# Patient Record
Sex: Female | Born: 1965 | Race: Black or African American | Hispanic: No | Marital: Married | Smoking: Never smoker
Health system: Southern US, Community
[De-identification: ages and names within clinical notes are randomized; demographics above are authoritative.]

## PROBLEM LIST (undated history)

## (undated) DIAGNOSIS — I10 Essential (primary) hypertension: Secondary | ICD-10-CM

## (undated) DIAGNOSIS — J45909 Unspecified asthma, uncomplicated: Secondary | ICD-10-CM

---

## 2012-11-29 ENCOUNTER — Emergency Department (HOSPITAL_BASED_OUTPATIENT_CLINIC_OR_DEPARTMENT_OTHER)
Admission: EM | Admit: 2012-11-29 | Discharge: 2012-11-29 | Disposition: A | Discharge status: Home / Self Care | Payer: Self-pay | Attending: Emergency Medicine | Admitting: Emergency Medicine

## 2012-11-29 ENCOUNTER — Encounter (HOSPITAL_BASED_OUTPATIENT_CLINIC_OR_DEPARTMENT_OTHER): Payer: Self-pay | Admitting: *Deleted

## 2012-11-29 DIAGNOSIS — Z79899 Other long term (current) drug therapy: Secondary | ICD-10-CM | POA: Insufficient documentation

## 2012-11-29 DIAGNOSIS — I10 Essential (primary) hypertension: Secondary | ICD-10-CM | POA: Insufficient documentation

## 2012-11-29 DIAGNOSIS — H6091 Unspecified otitis externa, right ear: Secondary | ICD-10-CM

## 2012-11-29 DIAGNOSIS — H60399 Other infective otitis externa, unspecified ear: Secondary | ICD-10-CM | POA: Insufficient documentation

## 2012-11-29 DIAGNOSIS — J069 Acute upper respiratory infection, unspecified: Secondary | ICD-10-CM | POA: Insufficient documentation

## 2012-11-29 DIAGNOSIS — Z91040 Latex allergy status: Secondary | ICD-10-CM | POA: Insufficient documentation

## 2012-11-29 HISTORY — DX: Essential (primary) hypertension: I10

## 2012-11-29 MED ORDER — NEOMYCIN-POLYMYXIN-HC 3.5-10000-1 OT SUSP: 4.0000 [drp] | Freq: Four times a day (QID) | OTIC | Status: DC

## 2012-11-29 MED ORDER — FLUTICASONE PROPIONATE 50 MCG/ACT NA SUSP: 2.0000 | Freq: Every day | NASAL | Status: DC

## 2012-11-29 NOTE — ED Notes (Addendum)
Sore throat, congestion, hoarse voice, earache. Pt was seen at [facility] and states that the abx makes her stomach cramp.

## 2012-11-29 NOTE — ED Provider Notes (Signed)
History    CSN: [id number] Arrival date & time 11/29/12  4098  First MD Initiated Contact with Patient 11/29/12 [phone]     Chief Complaint  Patient presents with  . Sore Throat   (Consider location/radiation/quality/duration/timing/severity/associated sxs/prior Treatment) HPI This 47 year old female has one half weeks of some nasal congestion and postnasal drainage right ear pain sore throat hoarse voice cough but no fever no rash no chest pain no wheezing no shortness of breath no vomiting no diarrhea no trauma no headache no stiff neck she started Bactrim yesterday from another emergency department for an unknown reason and when she took her Bactrim Last night and when she took a Bactrim tablet this morning just like she has in the past with prior antibiotics developed epigastric abdominal pain without nausea or vomiting and without rash without itching without shortness of breath without tongue swelling or lip swelling but states that she is intolerant of all antibiotics which always seem to cause epigastric pain whenever she takes them.She is taking a nasal decongestant as well as antihistamine and would like a refill on her Flonase which seems to help her nasal congestion but she ran out. Past Medical History  Diagnosis Date  . Hypertension    No past surgical history on file. No family history on file. History  Substance Use Topics  . Smoking status: Not on file  . Smokeless tobacco: Not on file  . Alcohol Use: Not on file   OB History   Grav Para Term Preterm Abortions TAB SAB Ect Mult Living                 Review of Systems 10 Systems reviewed and are negative for acute change except as noted in the HPI. Allergies  Latex  Home Medications   Current Outpatient Rx  Name  Route  Sig  Dispense  Refill  . fluticasone (FLONASE) 50 MCG/ACT nasal spray   Nasal   Place 2 sprays into the nose daily.   16 g   0   . neomycin-polymyxin-hydrocortisone (CORTISPORIN)  3.5-10000-1 otic suspension   Otic   Place 4 drops in ear(s) 4 (four) times daily. X 7 days   10 mL   0    BP 164/104  Pulse 102  Temp(Src) 98.4 F (36.9 C) (Oral)  Resp 18  SpO2 100% Physical Exam  Nursing note and vitals reviewed. Constitutional:  Awake, alert, nontoxic appearance.  HENT:  Head: Atraumatic.  Mouth/Throat: Oropharynx is clear and moist. No oropharyngeal exudate.  TMs clear bilaterally right external auditory canal is not swollen or purulence however does have erythema and tenderness in the canal without cellulitis to the ear itself  Eyes: Conjunctivae are normal. Right eye exhibits no discharge. Left eye exhibits no discharge.  Neck: Neck supple.  Cardiovascular: Normal rate and regular rhythm.   No murmur heard. Pulmonary/Chest: Effort normal and breath sounds normal. No respiratory distress. She has no wheezes. She has no rales. She exhibits no tenderness.  Abdominal: Soft. Bowel sounds are normal. She exhibits no distension and no mass. There is tenderness. There is no rebound and no guarding.  Minimal epigastric tenderness with the rest of the abdomen nontender  Musculoskeletal: She exhibits no tenderness.  Baseline ROM, no obvious new focal weakness.  Neurological: She is alert.  Mental status and motor strength appears baseline for patient and situation.  Skin: No rash noted.  Psychiatric: She has a normal mood and affect.    ED Course  Procedures (including  critical care time) Patient is confident the antibiotic upset her stomach and does not want any further evaluation of her abdominal pain today including no lab testing or imaging.Patient informed of clinical course, understand medical decision-making process, and agree with plan. Labs Reviewed - No data to display No results found. 1. URI (upper respiratory infection)   2. Right otitis externa     MDM  I doubt any other EMC precluding discharge at this time including, but not necessarily  limited to the following:SBI.  Hurman Horn, MD 11/29/12 [phone]

## 2013-05-03 ENCOUNTER — Emergency Department (HOSPITAL_COMMUNITY): Payer: No Typology Code available for payment source

## 2013-05-03 ENCOUNTER — Emergency Department (HOSPITAL_COMMUNITY)
Admission: EM | Admit: 2013-05-03 | Discharge: 2013-05-03 | Disposition: A | Discharge status: Home / Self Care | Payer: No Typology Code available for payment source | Attending: Emergency Medicine | Admitting: Emergency Medicine

## 2013-05-03 ENCOUNTER — Encounter (HOSPITAL_COMMUNITY): Payer: Self-pay | Admitting: Emergency Medicine

## 2013-05-03 DIAGNOSIS — Z888 Allergy status to other drugs, medicaments and biological substances status: Secondary | ICD-10-CM | POA: Insufficient documentation

## 2013-05-03 DIAGNOSIS — Y939 Activity, unspecified: Secondary | ICD-10-CM | POA: Insufficient documentation

## 2013-05-03 DIAGNOSIS — R269 Unspecified abnormalities of gait and mobility: Secondary | ICD-10-CM | POA: Insufficient documentation

## 2013-05-03 DIAGNOSIS — M25572 Pain in left ankle and joints of left foot: Secondary | ICD-10-CM

## 2013-05-03 DIAGNOSIS — Z880 Allergy status to penicillin: Secondary | ICD-10-CM | POA: Insufficient documentation

## 2013-05-03 DIAGNOSIS — S9000XA Contusion of unspecified ankle, initial encounter: Secondary | ICD-10-CM | POA: Insufficient documentation

## 2013-05-03 DIAGNOSIS — I10 Essential (primary) hypertension: Secondary | ICD-10-CM | POA: Insufficient documentation

## 2013-05-03 DIAGNOSIS — R209 Unspecified disturbances of skin sensation: Secondary | ICD-10-CM | POA: Insufficient documentation

## 2013-05-03 DIAGNOSIS — S9002XA Contusion of left ankle, initial encounter: Secondary | ICD-10-CM

## 2013-05-03 DIAGNOSIS — W208XXA Other cause of strike by thrown, projected or falling object, initial encounter: Secondary | ICD-10-CM | POA: Insufficient documentation

## 2013-05-03 DIAGNOSIS — Y929 Unspecified place or not applicable: Secondary | ICD-10-CM | POA: Insufficient documentation

## 2013-05-03 DIAGNOSIS — Z91040 Latex allergy status: Secondary | ICD-10-CM | POA: Insufficient documentation

## 2013-05-03 MED ORDER — IBUPROFEN 800 MG PO TABS: 800.0000 mg | ORAL_TABLET | Freq: Three times a day (TID) | ORAL | Status: DC

## 2013-05-03 MED ORDER — IBUPROFEN 400 MG PO TABS
800.0000 mg | ORAL_TABLET | Freq: Once | ORAL | Status: AC
  Administered 2013-05-03: 800 mg via ORAL
  Filled 2013-05-03: qty 2

## 2013-05-03 NOTE — ED Notes (Signed)
Pt. Not in the room after coming back from xray.

## 2013-05-03 NOTE — ED Notes (Signed)
PT comfortable with d/c and f/u instructions. Prescriptions x1 

## 2013-05-03 NOTE — ED Notes (Signed)
Pt states will take Ibuprofen or Tylenol but does not want anything stronger for pain

## 2013-05-03 NOTE — ED Notes (Signed)
Pt. Stated, i dropped a couch on my left foot a week ago and keeps getting worse.

## 2013-05-03 NOTE — ED Provider Notes (Signed)
CSN: [id number]     Arrival date & time 05/03/13  1618 History  This chart was scribed for non-physician practitioner, Dierdre Forth, PA-C working with Roney Marion, MD by Greggory Stallion, ED scribe. This patient was seen in room TR05C/TR05C and the patient's care was started at 5:39 PM.   Chief Complaint  Patient presents with  . Foot Injury   The history is provided by the patient and medical records. No language interpreter was used.   HPI Comments: Dawanna Grauberger is a 47 y.o. female who presents to the Emergency Department complaining of left foot injury that occurred one week ago when she dropped a couch on her foot. She has sudden onset, worsening, throbbing left foot pain. Pt has taken aleve and wrapped her foot in an ACE wrap with some relief. Certain movements and bearing weight worsen the pain. Pt states she has some tingling in her toes. Denies leg pain, numbness.  Past Medical History  Diagnosis Date  . Hypertension    History reviewed. No pertinent past surgical history. No family history on file. History  Substance Use Topics  . Smoking status: Not on file  . Smokeless tobacco: Not on file  . Alcohol Use: No   OB History   Grav Para Term Preterm Abortions TAB SAB Ect Mult Living                 Review of Systems  Constitutional: Negative for fever and chills.  Gastrointestinal: Negative for nausea and vomiting.  Musculoskeletal: Positive for arthralgias, gait problem (2/2 pain) and joint swelling. Negative for back pain, neck pain and neck stiffness.  Skin: Negative for wound.  Neurological: Negative for numbness.  Hematological: Does not bruise/bleed easily.  Psychiatric/Behavioral: The patient is not nervous/anxious.   All other systems reviewed and are negative.    Allergies  Latex; Penicillins; and Vagisil anti-itch medicated  Home Medications   Current Outpatient Rx  Name  Route  Sig  Dispense  Refill  . ibuprofen (ADVIL,MOTRIN) 200 MG tablet   Oral   Take 400 mg by mouth every 6 (six) hours as needed for mild pain (foot pain).         Marland Kitchen ibuprofen (ADVIL,MOTRIN) 800 MG tablet   Oral   Take 1 tablet (800 mg total) by mouth 3 (three) times daily.   21 tablet   0    BP 135/77  Pulse 80  Temp(Src) 98.4 F (36.9 C) (Oral)  Resp 16  SpO2 98%  Physical Exam  Nursing note and vitals reviewed. Constitutional: She appears well-developed and well-nourished. No distress.  HENT:  Head: Normocephalic and atraumatic.  Eyes: Conjunctivae are normal.  Neck: Normal range of motion.  Cardiovascular: Normal rate, regular rhythm, normal heart sounds and intact distal pulses.   No murmur heard. Capillary refill < 3 seconds  Pulmonary/Chest: Effort normal and breath sounds normal.  Musculoskeletal: She exhibits tenderness. She exhibits no edema.  ROM: full ROM with pain. Quarter sized area of ecchymosis to the anterior ankle with mild swelling laterally.  No PPT of the medial or lateral malleolus.    Neurological: She is alert. Coordination normal.  Sensation intact with subjective paraesthesias over lateral portion of the left foot.  Strength is 3/5 in left ankle secondary to pain.   Skin: Skin is warm and dry. She is not diaphoretic.  No tenting of the skin  Psychiatric: She has a normal mood and affect.    ED Course  Procedures (including critical care  time)  DIAGNOSTIC STUDIES: Oxygen Saturation is 98% on RA, normal by my interpretation.    COORDINATION OF CARE: 5:41 PM-Discussed treatment plan which includes xray with pt at bedside and pt agreed to plan.   Labs Review Labs Reviewed - No data to display Imaging Review Dg Ankle Complete Left  05/03/2013   CLINICAL DATA:  History of injury complaining of pain and swelling in the lateral side of the left foot.  EXAM: LEFT ANKLE COMPLETE - 3+ VIEW  COMPARISON:  No priors.  FINDINGS: Three views the left ankle demonstrate irregularity of the tip of the medial malleolus,  suggestive of an old healed avulsion fracture. No definite acute displaced fracture, subluxation or dislocation. Mild soft tissue swelling around the ankle joint diffusely. Dorsal and plantar calcaneal spurs are incidentally noted.  IMPRESSION: 1. No definite acute radiographic abnormality the bones of the left ankle. 2. Probable old healed avulsion fracture of the tip of the medial malleolus.   Electronically Signed   By: Trudie Reed M.D.   On: 05/03/2013 19:02   Dg Foot Complete Left  05/03/2013   CLINICAL DATA:  History injury to left foot complaining of pain and swelling.  EXAM: LEFT FOOT - COMPLETE 3+ VIEW  COMPARISON:  No priors.  FINDINGS: Multiple views of the left foot demonstrate no acute displaced fracture, subluxation, dislocation, or soft tissue abnormality. Small plantar and dorsal calcaneal spurs incidentally noted.  IMPRESSION: 1. No acute radiographic abnormality of the left foot.   Electronically Signed   By: Trudie Reed M.D.   On: 05/03/2013 19:01    EKG Interpretation   None       MDM   1. Ankle contusion, left, initial encounter   2. Arthralgia of left ankle      Garrel Ridgel presents with L ankle pain and minor contusion. Patient X-Ray negative for obvious fracture or dislocation. I personally reviewed the imaging tests through PACS system.  I reviewed available ER/hospitalization records through the EMR.  Pt advised to follow up with orthopedics if symptoms persist for possibility of missed fracture diagnosis. Patient given ASO brace while in ED, conservative therapy recommended and discussed. Pt ambulates without difficulty in the ED.  Patient will be dc home & is agreeable with above plan.  It has been determined that no acute conditions requiring further emergency intervention are present at this time. The patient/guardian have been advised of the diagnosis and plan. We have discussed signs and symptoms that warrant return to the ED, such as changes or  worsening in symptoms.   Vital signs are stable at discharge.   BP 135/77  Pulse 80  Temp(Src) 98.4 F (36.9 C) (Oral)  Resp 16  SpO2 98%  Patient/guardian has voiced understanding and agreed to follow-up with the PCP or specialist.    I personally performed the services described in this documentation, which was scribed in my presence. The recorded information has been reviewed and is accurate.    Dierdre Forth, PA-C 05/03/13 [id number]

## 2013-05-13 NOTE — ED Provider Notes (Signed)
Medical screening examination/treatment/procedure(s) were performed by non-physician practitioner and as supervising physician I was immediately available for consultation/collaboration.  EKG Interpretation   None         Roney Marion, MD 05/13/13 [phone]

## 2013-06-19 ENCOUNTER — Emergency Department (HOSPITAL_COMMUNITY)
Admission: EM | Admit: 2013-06-19 | Discharge: 2013-06-19 | Disposition: A | Discharge status: Home / Self Care | Payer: No Typology Code available for payment source | Attending: Emergency Medicine | Admitting: Emergency Medicine

## 2013-06-19 ENCOUNTER — Encounter (HOSPITAL_COMMUNITY): Payer: Self-pay | Admitting: Emergency Medicine

## 2013-06-19 ENCOUNTER — Emergency Department (HOSPITAL_COMMUNITY): Payer: No Typology Code available for payment source

## 2013-06-19 DIAGNOSIS — Z91040 Latex allergy status: Secondary | ICD-10-CM | POA: Insufficient documentation

## 2013-06-19 DIAGNOSIS — R070 Pain in throat: Secondary | ICD-10-CM

## 2013-06-19 DIAGNOSIS — K219 Gastro-esophageal reflux disease without esophagitis: Secondary | ICD-10-CM | POA: Insufficient documentation

## 2013-06-19 DIAGNOSIS — Z880 Allergy status to penicillin: Secondary | ICD-10-CM | POA: Insufficient documentation

## 2013-06-19 DIAGNOSIS — I10 Essential (primary) hypertension: Secondary | ICD-10-CM | POA: Insufficient documentation

## 2013-06-19 MED ORDER — OMEPRAZOLE 20 MG PO CPDR: 20.0000 mg | DELAYED_RELEASE_CAPSULE | Freq: Every day | ORAL | Status: DC

## 2013-06-19 NOTE — ED Notes (Addendum)
Per [facility], pt was at [employer] eating a croissant and on her last bite while swallowing felt something sharp in the back of her throat. Pt is hoarse. Progressively worsening hoarseness since EMS arrival to scene. Nothing in throat, no blood, not able to cough anything out.

## 2013-06-19 NOTE — Discharge Instructions (Signed)
Please call and set-up an appointment with ENT physician to be re-assessed within 48 hours.  Please rest and stay hydrated Please take medications as prescribed Please avoid spicy, acidic foods Please continue to monitor symptoms closely and if symptoms are to worsen or change (fever greater than 101, chills, neck pain, neck stiffness, chest pain, shortness of breath, difficulty breathing, neck swelling, coughing up or spitting up blood, confusion, weakness, inability to swallow) please report back to the ED   Diet for Gastroesophageal Reflux Disease, Adult Reflux (acid reflux) is when acid from your stomach flows up into the esophagus. When acid comes in contact with the esophagus, the acid causes irritation and soreness (inflammation) in the esophagus. When reflux happens often or so severely that it causes damage to the esophagus, it is called gastroesophageal reflux disease (GERD). Nutrition therapy can help ease the discomfort of GERD. FOODS OR DRINKS TO AVOID OR LIMIT  Smoking or chewing tobacco. Nicotine is one of the most potent stimulants to acid production in the gastrointestinal tract.  Caffeinated and decaffeinated coffee and black tea.  Regular or low-calorie carbonated beverages or energy drinks (caffeine-free carbonated beverages are allowed).   Strong spices, such as black pepper, white pepper, red pepper, cayenne, curry powder, and chili powder.  Peppermint or spearmint.  Chocolate.  High-fat foods, including meats and fried foods. Extra added fats including oils, butter, salad dressings, and nuts. Limit these to less than 8 tsp per day.  Fruits and vegetables if they are not tolerated, such as citrus fruits or tomatoes.  Alcohol.  Any food that seems to aggravate your condition. If you have questions regarding your diet, call your caregiver or a registered dietitian. OTHER THINGS THAT MAY HELP GERD INCLUDE:   Eating your meals slowly, in a relaxed  setting.  Eating 5 to 6 small meals per day instead of 3 large meals.  Eliminating food for a period of time if it causes distress.  Not lying down until 3 hours after eating a meal.  Keeping the head of your bed raised 6 to 9 inches (15 to 23 cm) by using a foam wedge or blocks under the legs of the bed. Lying flat may make symptoms worse.  Being physically active. Weight loss may be helpful in reducing reflux in overweight or obese adults.  Wear loose fitting clothing EXAMPLE MEAL PLAN This meal plan is approximately 2,000 calories based on https://www.bernard.org/ meal planning guidelines. Breakfast   cup cooked oatmeal.  1 cup strawberries.  1 cup low-fat milk.  1 oz almonds. Snack  1 cup cucumber slices.  6 oz yogurt (made from low-fat or fat-free milk). Lunch  2 slice whole-wheat bread.  2 oz sliced [place].  2 tsp mayonnaise.  1 cup blueberries.  1 cup snap peas. Snack  6 whole-wheat crackers.  1 oz string cheese. Dinner   cup brown rice.  1 cup mixed veggies.  1 tsp olive oil.  3 oz grilled fish. Document Released: 05/20/2005 Document Revised: 08/12/2011 Document Reviewed: 04/05/2011 [facility] Patient Information 2014 [place], [place]. Diet for Gastroesophageal Reflux Disease, Adult Reflux (acid reflux) is when acid from your stomach flows up into the esophagus. When acid comes in contact with the esophagus, the acid causes irritation and soreness (inflammation) in the esophagus. When reflux happens often or so severely that it causes damage to the esophagus, it is called gastroesophageal reflux disease (GERD). Nutrition therapy can help ease the discomfort of GERD. FOODS OR DRINKS TO AVOID OR LIMIT  Smoking  or chewing tobacco. Nicotine is one of the most potent stimulants to acid production in the gastrointestinal tract.  Caffeinated and decaffeinated coffee and black tea.  Regular or low-calorie carbonated beverages or energy drinks (caffeine-free  carbonated beverages are allowed).   Strong spices, such as black pepper, white pepper, red pepper, cayenne, curry powder, and chili powder.  Peppermint or spearmint.  Chocolate.  High-fat foods, including meats and fried foods. Extra added fats including oils, butter, salad dressings, and nuts. Limit these to less than 8 tsp per day.  Fruits and vegetables if they are not tolerated, such as citrus fruits or tomatoes.  Alcohol.  Any food that seems to aggravate your condition. If you have questions regarding your diet, call your caregiver or a registered dietitian. OTHER THINGS THAT MAY HELP GERD INCLUDE:   Eating your meals slowly, in a relaxed setting.  Eating 5 to 6 small meals per day instead of 3 large meals.  Eliminating food for a period of time if it causes distress.  Not lying down until 3 hours after eating a meal.  Keeping the head of your bed raised 6 to 9 inches (15 to 23 cm) by using a foam wedge or blocks under the legs of the bed. Lying flat may make symptoms worse.  Being physically active. Weight loss may be helpful in reducing reflux in overweight or obese adults.  Wear loose fitting clothing EXAMPLE MEAL PLAN This meal plan is approximately 2,000 calories based on https://www.bernard.org/ meal planning guidelines. Breakfast   cup cooked oatmeal.  1 cup strawberries.  1 cup low-fat milk.  1 oz almonds. Snack  1 cup cucumber slices.  6 oz yogurt (made from low-fat or fat-free milk). Lunch  2 slice whole-wheat bread.  2 oz sliced [place].  2 tsp mayonnaise.  1 cup blueberries.  1 cup snap peas. Snack  6 whole-wheat crackers.  1 oz string cheese. Dinner   cup brown rice.  1 cup mixed veggies.  1 tsp olive oil.  3 oz grilled fish. Document Released: 05/20/2005 Document Revised: 08/12/2011 Document Reviewed: 04/05/2011 [facility] Patient Information 2014 [place], [place].   Emergency Department Resource Guide 1) Find a Doctor and Pay  Out of Pocket Although you won't have to find out who is covered by your insurance plan, it is a good idea to ask around and get recommendations. You will then need to call the office and see if the doctor you have chosen will accept you as a new patient and what types of options they offer for patients who are self-pay. Some doctors offer discounts or will set up payment plans for their patients who do not have insurance, but you will need to ask so you aren't surprised when you get to your appointment.  2) Contact Your Local Health Department Not all health departments have doctors that can see patients for sick visits, but many do, so it is worth a call to see if yours does. If you don't know where your local health department is, you can check in your phone book. The CDC also has a tool to help you locate your state's health department, and many state websites also have listings of all of their local health departments.  3) Find a Walk-in Clinic If your illness is not likely to be very severe or complicated, you may want to try a walk in clinic. These are popping up all over the country in pharmacies, drugstores, and shopping centers. They're usually staffed by nurse practitioners  or physician assistants that have been trained to treat common illnesses and complaints. They're usually fairly quick and inexpensive. However, if you have serious medical issues or chronic medical problems, these are probably not your best option.  No Primary Care Doctor: - Call Health Connect at  [phone] - they can help you locate a primary care doctor that  accepts your insurance, provides certain services, etc. - Physician Referral Service- [phone]  Chronic Pain Problems: Organization         Address  Phone   Notes  [facility]  [phone] Patients need to be referred by their primary care doctor.   Medication Assistance: Organization         Address  Phone   Notes  [facility]  Medication [facility] [address] [place], [place] [address] [phone] --Must be a resident of [facility] -- Must have NO insurance coverage whatsoever (no Medicaid/ Medicare, etc.) -- The pt. MUST have a primary care doctor that directs their care regularly and follows them in the community   Med[facility]ist  [phone]   [employer]  [phone]    Agencies that provide inexpensive medical care: Organization         Address  Phone   Notes  [facility]  [phone]   [facility]    [phone]   [facility] [address] [place], [place] [address] [phone]   [facility] [address] [place] [place] [phone]   Planned Parenthood    [phone]   [facility]    [phone]   [facility]  [address], [place] Phone:  [phone], Fax:  [phone] Hours of Operation:  9 am - 6 pm, M-F.  Also accepts Medicaid/Medicare and self-pay.  [facility]  [address], [place] Phone: [phone], Fax: [phone]. Hours of Operation:  8:30 am - 5:30 pm, M-F.  Also accepts Medicaid and self-pay.  [facility] [place] [address] [place] Phone: [phone]   [facility] [address] Natasha Bence [place], [place] [phone], Ext. [phone] Mondays & Thursdays: 7-9 AM.  First 15 patients are seen on a first come, first serve basis.    Medicaid-accepting [facility] Providers:  Organization         Address  Phone   Notes  [facility] [address], [place] [phone] Also accepts self-pay patients.  [facility] [address] Laurell Josephs [place], [place]  [phone]   [facility] [address], [place] [phone]   [facility] [address] [place] [phone]   Renaye Rakers [address], [place]   [phone] Only accepts [place] Access [place] patients after they have their name applied to their card.   Self-Pay (no insurance) in [facility]:  Organization         Address  Phone   Notes  Sickle Cell Patients, [facility] [address] [place], [place] [phone]   [facility] [address] [place], [place] [phone]   [facility] [place]  [address], [place] [phone]   [facility]/Dr. Julio Sicks  [phone]  [address], [place] or [address], [place] [phone] Phone number for both [place] and [place] locations is the same.  [facility] [address], [place] [phone]   [facility] [place] [address] [place] [address], [place] or [address] [phone]   [facility] [address], [place] [phone]) [phone], phone; [phone], fax Sees patients 1st and 3rd Saturday of every month.  Must not qualify for public or private insurance (i.e. Medicaid, Medicare, [place] Health Choice, Veterans' Benefits)  Household income should be no more than 200% of the poverty level The clinic cannot treat you if you are pregnant or think you are pregnant  Sexually transmitted diseases are not treated at the clinic.    Dental Care: Organization         Address  Phone  Notes  [facility] [address], [place] [phone] Accepts children up to age 521 who are enrolled in [place]icaid or [place] Health Choice; pregnant women with a Medicaid card; and children who have applied for Medicaid or [place] Health Choice, but were declined, whose parents can pay a reduced fee at time of service.  [facility] [place]  [address], [place]  [phone] Accepts children up to age 50 who are enrolled in [place]icaid or [place] Health Choice; pregnant women with a Medicaid card; and children who have applied for Medicaid or [place] Health Choice, but were declined, whose parents can pay a reduced fee at time of service.  [facility]  [address], [place] [phone]) [phone] Patients are seen by appointment only. Walk-ins are not accepted. [facility] will see patients 50 years of age and older. Monday - Tuesday (8am-5pm) Most Wednesdays (8:30-5pm) $30 per visit, cash only  [facility]  [address], [place] [facility] [place] [phone]) [phone] Patients are seen by appointment only. Walk-ins are not accepted. [facility] will see patients 50 years of age and older. One Wednesday Evening (Monthly: Volunteer Based).  $30 per visit, cash only  [employer] [facility]  [phone] for adults; Children under age 684, call Graduate Pediatric Dentistry at [phone]. Children aged 304-14, please call [phone] to request a pediatric application.  Dental services are provided in all areas of dental care including fillings, crowns and bridges, complete and partial dentures, implants, gum treatment, root canals, and extractions. Preventive care is also provided. Treatment is provided to both adults and children. Patients are selected via a lottery and there is often a waiting list.   [facility] [address], [place]  [phone] [email or web address]   [facility] [address], [place], [place] [phone], Ext. [phone] Second and Fourth Thursday of each month, opens at 6:30 AM; Clinic ends at 9 AM.  Patients are seen on a first-come first-served basis, and a limited number are seen during each clinic.   [facility]  [address], [place], [place] [phone]   Eligibility Requirements You must have lived in [place], [place], or [place]  counties for at least the last three months.   You cannot be eligible for state or federal sponsored National Cityhealthcare insurance, including CIGNAVeterans Administration, [place]icaid, or [employer]Medicare.   You generally cannot be eligible for healthcare insurance through your employer.  How to apply: Eligibility screenings are held every Tuesday and Wednesday afternoon from 1:00 pm until 4:00 pm. You do not need an appointment for the interview!  [facility] [address], [place], [place]   [place]  [place] Department  [place]  [phone]    Behavioral Health Resources in the Community: Intensive Outpatient Programs Organization         Address  Phone  Notes  Uriah Braham. [address] [place], [place] [phone]   [facility] [address], [place], [place]   ADS: [facility] Drug [facility] [address] [place], [place]   [place] [address],  [place], [place] or [phone]   Substance Abuse Resources Organization         Address  Phone  Notes  Alcohol and Drug Services  [phone]   [place]  [phone]   The [place]   Chinita Pester  [phone]   Residential & Outpatient Substance Abuse Program  [phone]   Psychological Services Organization         Address  Phone  Notes  [facility] [place]  [place]  [phone]   [place] [address] [place] or [phone]    Mobile Crisis Teams Organization         Address  Phone  Notes  [facility], Mobile Crisis Care Unit  [phone]   [facility]  [address] [place], [place]   [place] Levels [address], [place] [phone]    Self-Help/Support Groups Organization         Address  Phone             Notes  [place]. of [place] - variety of support groups  [place] Call for more information  Narcotics Anonymous (NA), [facility]aring [facility] [address], [employer] [place]  2 meetings at this location   Special educational needs teacher         Address  Phone  Notes  [facility]idential [facility] [place],    [place]  [phone]   [facility]  [address] [place] 426834, [place], [place]   [facility] [place], [place] [phone] Admissions: 8am-3pm M-F  Incentives Substance [place] [address],    [place], [place] [phone]   [facility] [address] [place], [place], [place]   [facility] [address]  [place], [place]   [facility] - Intensive Outpatient [address] Kristeen [address], [place], [place]   [facility] ([place].,  [place], [place] [phone] or [phone]   [facility] (RTS) [address], [place], [place] Accepts Medicaid  [facility] [address]  [place] [phone] Substance Abuse/Addiction Treatment   [facility] Organization         Address  Phone  Notes  [facility]  [phone]   Domenic Schwab, PhD [address] Arlis Porta [place], [place]   [phone] or [phone]   [place], [place] [phone]   [facility] [address],  Michell [place], [place] [phone] Insurance/Medicaid/sponsorship through [facility] and Families [address]                                    [place], [place] [phone] Therapy/tele-psych/case  [facility] [address]   [place], [place] [phone]    Dr. Lolly Mustache  [phone]   [facility]  [employer] [facility] Dept. 1) [address], [place] 2) [address], [place] 3)  [address], [place] [phone]  [phone]   [facility] Child Abuse Hotline (831)502-9266 or [phone] (After Hours)

## 2013-06-19 NOTE — ED Notes (Addendum)
Able to eat and drink without any difficulty swallowing. However, still feels a pain with swallowing, like whatever goes down is irritating the area on right side. Drank a cup of water, ate applesauce, and a bite of Malaw[place]urkey sandwich.

## 2013-06-19 NOTE — ED Notes (Signed)
ENT, MD at bedside 

## 2013-06-19 NOTE — ED Provider Notes (Signed)
CSN: [id number]     Arrival date & time 06/19/13  2130 History   First MD Initiated Contact with Patient 06/19/13 1006     Chief Complaint  Patient presents with  . Pain   (Consider location/radiation/quality/duration/timing/severity/associated sxs/prior Treatment) The history is provided by the patient. No language interpreter was used.  Sara Sexton is a 48 year old female with past medical history of hypertension presenting to emergency department with throat discomfort. Patient reports that this morning at approximately 8:30 AM she was eating an egg croissant and french toast sticks at [employer]. Patient reported that when she took the last bite of her breakfast she felt something sharp in her throat. Patient reports that she had immediate discomfort upon swallowing shortly after the event -denies discomfort now. Patient reports she's been having mild hoarseness starting after the event. Patient denies difficulty swallowing or pain with swallowing at the moment-reports she's asymptomatic. Patient reports that her daughter looked into her throat with light on her phone and noted that patient was bleeding - patient was scared and came to the ED. Patient denies metallic taste. Patient reported right-sided neck pain described as an aching sensation, soreness with radiation to the right ear. Denied swelling of the neck, chest pain, shortness of breath, difficulty breathing, numbness, tingling, nausea, vomiting. PCP none  Past Medical History  Diagnosis Date  . Hypertension    History reviewed. No pertinent past surgical history. No family history on file. History  Substance Use Topics  . Smoking status: Never Smoker   . Smokeless tobacco: Not on file  . Alcohol Use: No   OB History   Grav Para Term Preterm Abortions TAB SAB Ect Mult Living                 Review of Systems  Constitutional: Negative for fever and chills.  HENT:       Hoarseness   Respiratory: Negative for chest  tightness and shortness of breath.   Cardiovascular: Negative for chest pain.  Gastrointestinal: Negative for nausea, vomiting, abdominal pain, diarrhea and constipation.  Musculoskeletal: Positive for neck pain.  Neurological: Negative for dizziness and weakness.  All other systems reviewed and are negative.    Allergies  Latex; Penicillins; and Vagisil anti-itch medicated  Home Medications   Current Outpatient Rx  Name  Route  Sig  Dispense  Refill  . omeprazole (PRILOSEC) 20 MG capsule   Oral   Take 1 capsule (20 mg total) by mouth daily.   15 capsule   0    BP 105/65  Pulse 67  Temp(Src) 98.1 F (36.7 C) (Oral)  Resp 16  SpO2 100% Physical Exam  Nursing note and vitals reviewed. Constitutional: She is oriented to person, place, and time. She appears well-developed and well-nourished. No distress.  HENT:  Head: Normocephalic and atraumatic.  Mouth/Throat: Oropharynx is clear and moist. No oropharyngeal exudate.   Negative swelling, erythema, inflammation, lesions, sores noted to the posterior oropharynx and bilateral tonsils. Negative findings of bleeding identified to the posterior oropharynx. Uvula midline, symmetrical elevation. Negative uvula swelling identified. Hoarse voice  Eyes: Conjunctivae and EOM are normal. Pupils are equal, round, and reactive to light. Right eye exhibits no discharge. Left eye exhibits no discharge.  Neck: Normal range of motion. Neck supple. No tracheal deviation present.  Mild discomfort upon palpation to the right side of the neck gas muscular nature Negative cervical lymphadenopathy Negative nuchal rigidity Negative meningeal signs  Cardiovascular: Normal rate, regular rhythm and normal heart sounds.  Exam reveals no friction rub.   No murmur heard. Pulses:      Radial pulses are 2+ on the right side, and 2+ on the left side.  Cap refill less than 3 seconds  Pulmonary/Chest: Effort normal and breath sounds normal. No respiratory  distress. She has no wheezes. She has no rales. She exhibits no tenderness.  Airway intact Negative stridor Patient able to speak in full sentences without difficulty Negative use of accessory muscles  Musculoskeletal: Normal range of motion. She exhibits no tenderness.  Full ROM to upper and lower extremities without difficulty noted, negative ataxia noted.  Lymphadenopathy:    She has no cervical adenopathy.  Neurological: She is alert and oriented to person, place, and time. No cranial nerve deficit. She exhibits normal muscle tone. Coordination normal.  Cranial nerves III through XII grossly intact  Skin: Skin is warm and dry. No rash noted. She is not diaphoretic. No erythema.  Psychiatric: She has a normal mood and affect. Her behavior is normal. Thought content normal.    ED Course  Procedures (including critical care time)  1:44 PM This provider re-assessed patient. Reported that she continues to have discomfort with swallowing. Patient was able to tolerate fluids PO and food without difficulty - stated pain is worse with swallowing than with talking.   1:47 PM Dr. Alden ServerS. Jacubowitz to see and assess patient.   1:52 PM Dr. Dub MikesS. Jacobowitz saw and assess patient, recommended ENT consult.  1:55 PM This provider spoke with Dr. Jearld FentonByers - discussed case, history, presentation. Dr. Jearld FentonByers to see and assess patient.   2:56 PM This provider spoke with Dr. Jearld FentonByers who saw and assessed patient. Fiber-optic scope performed with mild erythema noted. Negative findings of acute abnormalities noted. Patient tolerated procedure well. Dr. Jearld FentonByers believes this to be GERD related, recommended patient to be placed on Omeprazole.   Dg Neck Soft Tissue  06/19/2013   CLINICAL DATA:  Right throat pain. Foreign body sensation in throat.  EXAM: NECK SOFT TISSUES - 1+ VIEW  COMPARISON:  None.  FINDINGS: The prevertebral soft tissues are normal. The epiglottis appears normal. There is calcification in the region of the  arytenoid cartilage. No foreign bodies are identified. Moderate cervical spondylosis is noted.  IMPRESSION: No evidence of foreign body or airway obstruction. Cervical spondylosis.   Electronically Signed   By: Roxy HorsemanBill  Veazey M.D.   On: 06/19/2013 12:58   Dg Chest 2 View  06/19/2013   CLINICAL DATA:  Right throat pain. Foreign body sensation in throat.  EXAM: CHEST  2 VIEW  COMPARISON:  None.  FINDINGS: The heart size and mediastinal contours are normal. The lungs are clear. There is no pleural effusion or pneumothorax. No acute osseous findings are identified. No foreign bodies are identified. Cholecystectomy clips are noted.  IMPRESSION: No active cardiopulmonary process.   Electronically Signed   By: Roxy HorsemanBill  Veazey M.D.   On: 06/19/2013 12:57   Labs Review Labs Reviewed - No data to display Imaging Review Dg Neck Soft Tissue  06/19/2013   CLINICAL DATA:  Right throat pain. Foreign body sensation in throat.  EXAM: NECK SOFT TISSUES - 1+ VIEW  COMPARISON:  None.  FINDINGS: The prevertebral soft tissues are normal. The epiglottis appears normal. There is calcification in the region of the arytenoid cartilage. No foreign bodies are identified. Moderate cervical spondylosis is noted.  IMPRESSION: No evidence of foreign body or airway obstruction. Cervical spondylosis.   Electronically Signed   By: Roxy HorsemanBill  Veazey  M.D.   On: 06/19/2013 12:58   Dg Chest 2 View  06/19/2013   CLINICAL DATA:  Right throat pain. Foreign body sensation in throat.  EXAM: CHEST  2 VIEW  COMPARISON:  None.  FINDINGS: The heart size and mediastinal contours are normal. The lungs are clear. There is no pleural effusion or pneumothorax. No acute osseous findings are identified. No foreign bodies are identified. Cholecystectomy clips are noted.  IMPRESSION: No active cardiopulmonary process.   Electronically Signed   By: Roxy Horseman M.D.   On: 06/19/2013 12:57    EKG Interpretation   None       MDM   1. GERD (gastroesophageal  reflux disease)   2. Throat pain     Filed Vitals:   06/19/13 1136 06/19/13 1234 06/19/13 1330 06/19/13 1400  BP: 112/71 112/68 117/72 105/65  Pulse: 72 68 64 67  Temp:      TempSrc:      Resp: 16 16  16   SpO2: 100% 100% 100% 100%    Patient presenting to emergency department with throat discomfort and neck pain that started this morning at approximately 8:30 AM after patient was eating breakfast at [facility]. Patient reports that with her last bite of the breakfast she noticed and felt something sharp that she swallowed. Patient reports that she no longer has any discomfort when swallowing, concerned regarding that there may be something caught in her throat. Alert oriented. GCS 15. Heart rate and rhythm normal. Lungs clear to auscultation. Radial 2+ bilaterally. Uvula midline, symmetrical elevation. Negative trismus. Negative swelling, erythema, inflammation, lesions, sores, bleeding noted to posterior oropharynx and bilateral tonsils. Negative uvula swelling. Cranial nerves grossly intact. Negative swelling localized to the neck. Discomfort upon palpation to the right side-muscular nature. Negative cervical lymphadenopathy. Negative stridor. Airway intact, patient stable to speak in full sentences without difficulty. Plain film of chest negative findings. Plain film of soft tissue neck negative for airway obstruction, foreign body negative findings. Patient seen and assessed by Dr. Jearld Fenton, ENT, who performed a fiber-optic scope. Reported that there was nothing acutely abnormal. Recommended that this is mainly a GERD like reaction. Recommended patient to be placed on Prilosec as outpatient and follow-up with ENT if symptoms are to worsen. Dr. Jearld Fenton believes this to be an acute exacerbation of GERD.  Patient stable, afebrile. Negative acute findings on plain films. Negative acute abnormalities noted to fiber-optic scoping. Patient tolerated procedure well. Negative lesions. Suspicion to be acute  exacerbation of GERD. Discharged patient with Prilosec. Discharged patient with referral to ENT to be re-assessed within 24-48 hours. Discussed with patient to avoid spicy foods, acidic foods. Discussed with patient to rest and stay hydrated. Discussed with patient to closely monitor symptoms and if symptoms are to worsen or change to report back to the ED - strict return instructions given.  Patient agreed to plan of care, understood, all questions answered.    Raymon Mutton, PA-C 06/19/13 2150

## 2013-06-19 NOTE — Consult Note (Signed)
Reason for Consult: Hoarseness Referring Physician: Emergency room  Garrel RidgelSheryl Sexton is an 48 y.o. female.  HPI: 48 year old who was [facility] and on the last bite of the meal felt pain in the right side of her throat. She is had a persistent soreness since that time. She has had also hoarseness. She has had hoarseness issues like this previously or some type of crusts onwhen she choked her coughed on food. She is still able to get fluids and food down. Is not had an upper respiratory infection recently. She does have symptoms suggestive of reflux on a chronic basis.  Past Medical History  Diagnosis Date  . Hypertension     History reviewed. No pertinent past surgical history.  No family history on file.  Social History:  reports that she has never smoked. She does not have any smokeless tobacco history on file. She reports that she does not drink alcohol or use illicit drugs.  Allergies:  Allergies  Allergen Reactions  . Latex   . Penicillins Hives and Itching  . Vagisil Anti-Itch Medicated [Pramoxine Hcl] Other (See Comments)    Any vaginal yeast infection medication with applicators make her break out    Medications: I have reviewed the patient's current medications.  No results found for this or any previous visit (from the past 48 hour(s)).  Dg Neck Soft Tissue  06/19/2013   CLINICAL DATA:  Right throat pain. Foreign body sensation in throat.  EXAM: NECK SOFT TISSUES - 1+ VIEW  COMPARISON:  None.  FINDINGS: The prevertebral soft tissues are normal. The epiglottis appears normal. There is calcification in the region of the arytenoid cartilage. No foreign bodies are identified. Moderate cervical spondylosis is noted.  IMPRESSION: No evidence of foreign body or airway obstruction. Cervical spondylosis.   Electronically Signed   By: Roxy HorsemanBill  Veazey M.D.   On: 06/19/2013 12:58   Dg Chest 2 View  06/19/2013   CLINICAL DATA:  Right throat  pain. Foreign body sensation in throat.  EXAM: CHEST  2 VIEW  COMPARISON:  None.  FINDINGS: The heart size and mediastinal contours are normal. The lungs are clear. There is no pleural effusion or pneumothorax. No acute osseous findings are identified. No foreign bodies are identified. Cholecystectomy clips are noted.  IMPRESSION: No active cardiopulmonary process.   Electronically Signed   By: Roxy HorsemanBill  Veazey M.D.   On: 06/19/2013 12:57    ROS Blood pressure 112/68, pulse 68, temperature 98.1 F (36.7 C), temperature source Oral, resp. rate 16, SpO2 100.00%. Physical Exam  Constitutional: She appears well-developed and well-nourished.  HENT:  She is awake and alert. Her voice volume is low but I can hear points of normal voice as she is speaking. she no stridor. Nose is clear. Oral cavity/oropharynx-I see no evidence of any lesions or trauma. She does have some hypertrophic lymph tissue in the posterior pharyngeal wall consistent with postnasal drip. Fiberoptic exam shows nasopharynx, pharynx, hypopharynx without any evidence of lesions or exudate. The vocal cords both move normally. There is no lesions. There is erythema of both vocal processes and arytenoids that is equal bilaterally. This is consistent with reflux. Her neck is without masses but she does have tenderness in the right sternocleidomastoid muscle. She is not allow examination of that side well.    Assessment/Plan: Hoarseness-I don't see any evidence of a trauma or injury to her pharynx. The vocal cords have evidence or reflux but no evidence of  any lesions. I suspect that she has reflux as a chronic basis and perhaps had an acute exacerbation. He has normal soft tissue x-rays and eyebrow take exam was within normal limits except for the erythema of the arytenoids. I would recommend Prilosec twice a day. Talked about reflux precautions. If she continues to have the soreness to followup in the next 24-48 hours sooner if it is  worse.  [name] 06/19/2013, 2:47 PM

## 2013-06-19 NOTE — ED Notes (Signed)
Patient transported to X-ray 

## 2013-06-19 NOTE — ED Provider Notes (Signed)
Complains of pain in her right throat and some hoarseness onset immediately after swallowing the last by her sandwich this morning. She thinks she swallowed something sharp. Pain is worse with swallowing and with talking. She is in no respiratory distress. Very slightly hoarse. Handling secretions well. Oral pharynx is normal. Neck nontender. No swelling.  Doug SouSam Jerold Yoss, MD 06/19/13 1357

## 2013-06-19 NOTE — ED Notes (Signed)
Patient returned from X-ray 

## 2013-06-20 NOTE — ED Provider Notes (Signed)
Medical screening examination/treatment/procedure(s) were conducted as a shared visit with non-physician practitioner(s) and myself.  I personally evaluated the patient during the encounter.  EKG Interpretation   None        Doug SouSam Sedonia Kitner, MD 06/20/13 [phone]

## 2014-02-08 ENCOUNTER — Emergency Department (HOSPITAL_COMMUNITY): Payer: Self-pay

## 2014-02-08 ENCOUNTER — Emergency Department (HOSPITAL_COMMUNITY): Payer: No Typology Code available for payment source

## 2014-02-08 ENCOUNTER — Emergency Department (HOSPITAL_COMMUNITY)
Admission: EM | Admit: 2014-02-08 | Discharge: 2014-02-09 | Disposition: A | Discharge status: Home / Self Care | Payer: Self-pay | Attending: Emergency Medicine | Admitting: Emergency Medicine

## 2014-02-08 ENCOUNTER — Encounter (HOSPITAL_COMMUNITY): Payer: Self-pay | Admitting: Emergency Medicine

## 2014-02-08 DIAGNOSIS — I10 Essential (primary) hypertension: Secondary | ICD-10-CM | POA: Insufficient documentation

## 2014-02-08 DIAGNOSIS — R519 Headache, unspecified: Secondary | ICD-10-CM

## 2014-02-08 DIAGNOSIS — Z91040 Latex allergy status: Secondary | ICD-10-CM | POA: Insufficient documentation

## 2014-02-08 DIAGNOSIS — R079 Chest pain, unspecified: Secondary | ICD-10-CM | POA: Insufficient documentation

## 2014-02-08 DIAGNOSIS — Z880 Allergy status to penicillin: Secondary | ICD-10-CM | POA: Insufficient documentation

## 2014-02-08 DIAGNOSIS — R51 Headache: Secondary | ICD-10-CM | POA: Insufficient documentation

## 2014-02-08 DIAGNOSIS — Z79899 Other long term (current) drug therapy: Secondary | ICD-10-CM | POA: Insufficient documentation

## 2014-02-08 DIAGNOSIS — R110 Nausea: Secondary | ICD-10-CM | POA: Insufficient documentation

## 2014-02-08 LAB — CBC
HEMATOCRIT: 36.2 % (ref 36.0–46.0)
HEMOGLOBIN: 12.3 g/dL (ref 12.0–15.0)
MCH: 32.2 pg (ref 26.0–34.0)
MCHC: 34 g/dL (ref 30.0–36.0)
MCV: 94.8 fL (ref 78.0–100.0)
Platelets: 251 10*3/uL (ref 150–400)
RBC: 3.82 MIL/uL — ABNORMAL LOW (ref 3.87–5.11)
RDW: 13 % (ref 11.5–15.5)
WBC: 5.9 10*3/uL (ref 4.0–10.5)

## 2014-02-08 LAB — BASIC METABOLIC PANEL
Anion gap: 10 (ref 5–15)
BUN: 10 mg/dL (ref 6–23)
CHLORIDE: 102 meq/L (ref 96–112)
CO2: 27 mEq/L (ref 19–32)
Calcium: 9.3 mg/dL (ref 8.4–10.5)
Creatinine, Ser: 1.05 mg/dL (ref 0.50–1.10)
GFR calc non Af Amer: 61 mL/min — ABNORMAL LOW (ref >90)
GFR, EST AFRICAN AMERICAN: 71 mL/min — AB (ref >90)
Glucose, Bld: 95 mg/dL (ref 70–99)
POTASSIUM: 3.5 meq/L — AB (ref 3.7–5.3)
SODIUM: 139 meq/L (ref 137–147)

## 2014-02-08 LAB — I-STAT TROPONIN, ED: TROPONIN I, POC: 0 ng/mL (ref 0.00–0.08)

## 2014-02-08 MED ORDER — METOCLOPRAMIDE HCL 5 MG/ML IJ SOLN
10.0000 mg | Freq: Once | INTRAMUSCULAR | Status: AC
  Administered 2014-02-08: 10 mg via INTRAVENOUS
  Filled 2014-02-08: qty 2

## 2014-02-08 MED ORDER — DIPHENHYDRAMINE HCL 50 MG/ML IJ SOLN
25.0000 mg | Freq: Once | INTRAMUSCULAR | Status: AC
  Administered 2014-02-08: 25 mg via INTRAVENOUS
  Filled 2014-02-08: qty 1

## 2014-02-08 MED ORDER — NITROGLYCERIN 0.4 MG SL SUBL: 0.4000 mg | SUBLINGUAL_TABLET | SUBLINGUAL | Status: DC | PRN

## 2014-02-08 MED ORDER — ASPIRIN 325 MG PO TABS
325.0000 mg | ORAL_TABLET | Freq: Once | ORAL | Status: AC
  Administered 2014-02-08: 325 mg via ORAL
  Filled 2014-02-08: qty 1

## 2014-02-08 NOTE — ED Notes (Signed)
Pt reports generalized progressively worsening HA x 4 hours. Denies taking anything for pain. Pt denies vision changes. Pt also reports 10/10 right sided non-radiating chest pressure starting appx 1 hour ago with dizziness and nausea. Pt in NAD. AO x4.

## 2014-02-09 LAB — I-STAT TROPONIN, ED: TROPONIN I, POC: 0 ng/mL (ref 0.00–0.08)

## 2014-02-09 NOTE — ED Provider Notes (Signed)
CSN: [id number]     Arrival date & time 02/08/14  1821 History   First MD Initiated Contact with Patient 02/08/14 2123     Chief Complaint  Patient presents with  . Chest Pain  . Headache     (Consider location/radiation/quality/duration/timing/severity/associated sxs/prior Treatment) Patient is a 48 y.o. female presenting with chest pain and headaches. The history is provided by the patient.  Chest Pain Pain location:  R chest Pain quality: pressure   Pain quality: not aching and not burning   Pain radiates to:  Does not radiate Pain radiates to the back: no   Pain severity:  Moderate Onset quality:  Gradual Duration:  1 hour Timing:  Constant Progression:  Improving (was 10/10, now 3/10) Chronicity:  New Context: at rest   Relieved by:  Rest Worsened by:  Exertion Associated symptoms: headache and nausea   Associated symptoms: no abdominal pain, no cough, no fever, no shortness of breath and not vomiting   Headaches:    Severity:  Mild   Onset quality:  Gradual   Duration:  4 hours   Timing:  Constant   Progression:  Unchanged   Chronicity:  New Headache Pain location:  Generalized Quality:  Dull (pressure, band like) Radiates to:  Does not radiate Onset quality:  Gradual Timing:  Constant Progression:  Unchanged Associated symptoms: nausea   Associated symptoms: no abdominal pain, no cough, no fever and no vomiting     Past Medical History  Diagnosis Date  . Hypertension    History reviewed. No pertinent past surgical history. No family history on file. History  Substance Use Topics  . Smoking status: Never Smoker   . Smokeless tobacco: Not on file  . Alcohol Use: No   OB History   Grav Para Term Preterm Abortions TAB SAB Ect Mult Living                 Review of Systems  Constitutional: Negative for fever.  Respiratory: Negative for cough and shortness of breath.   Cardiovascular: Negative for chest pain and leg swelling.  Gastrointestinal:  Positive for nausea. Negative for vomiting and abdominal pain.  Neurological: Positive for headaches.  All other systems reviewed and are negative.     Allergies  Latex; Penicillins; and Vagisil anti-itch medicated  Home Medications   Prior to Admission medications   Medication Sig Start Date End Date Taking? Authorizing Provider  Cyanocobalamin (VITAMIN B-12 PO) Take 1 tablet by mouth daily.   Yes Historical Provider, MD  Multiple Vitamin (MULTIVITAMIN WITH MINERALS) TABS tablet Take 1 tablet by mouth daily.   Yes Historical Provider, MD  Pyridoxine HCl (VITAMIN B-6 PO) Take by mouth.   Yes Historical Provider, MD   BP 136/99  Pulse 76  Temp(Src) 97.9 F (36.6 C) (Oral)  Resp 15  SpO2 100% Physical Exam  Nursing note and vitals reviewed. Constitutional: She is oriented to person, place, and time. She appears well-developed and well-nourished. No distress.  HENT:  Head: Normocephalic and atraumatic.  Mouth/Throat: Oropharynx is clear and moist.  Eyes: EOM are normal. Pupils are equal, round, and reactive to light.  Neck: Normal range of motion. Neck supple.  Cardiovascular: Normal rate and regular rhythm.  Exam reveals no friction rub.   No murmur heard. Pulmonary/Chest: Effort normal and breath sounds normal. No respiratory distress. She has no wheezes. She has no rales.  Abdominal: Soft. She exhibits no distension. There is no tenderness. There is no rebound.  Musculoskeletal: Normal range  of motion. She exhibits no edema.  Neurological: She is alert and oriented to person, place, and time. No cranial nerve deficit. She exhibits normal muscle tone. Coordination normal.  Skin: Skin is warm. No rash noted. She is not diaphoretic.    ED Course  Procedures (including critical care time) Labs Review Labs Reviewed  CBC - Abnormal; Notable for the following:    RBC 3.82 (*)    All other components within normal limits  BASIC METABOLIC PANEL - Abnormal; Notable for the  following:    Potassium 3.5 (*)    GFR calc non Af Amer 61 (*)    GFR calc Af Amer 71 (*)    All other components within normal limits  I-STAT TROPOININ, ED  Rosezena Sensor, ED    Imaging Review Dg Chest 2 View  02/08/2014   CLINICAL DATA:  Chest pain  EXAM: CHEST  2 VIEW  COMPARISON:  June 19, 2013  FINDINGS: The heart size and mediastinal contours are within normal limits. There is no focal infiltrate, pulmonary edema, or pleural effusion. Prior cholecystectomy clips are identified. The visualized skeletal structures are unremarkable.  IMPRESSION: No active cardiopulmonary disease.   Electronically Signed   By: Sherian Rein M.D.   On: 02/08/2014 19:57   Ct Head Wo Contrast  02/08/2014   CLINICAL DATA:  Headache  EXAM: CT HEAD WITHOUT CONTRAST  TECHNIQUE: Contiguous axial images were obtained from the base of the skull through the vertex without intravenous contrast.  COMPARISON:  None.  FINDINGS: No evidence of parenchymal hemorrhage or extra-axial fluid collection. No mass lesion, mass effect, or midline shift.  No CT evidence of acute infarction.  Cerebral volume is within normal limits.  No ventriculomegaly.  The visualized paranasal sinuses are essentially clear. Partial opacification of the left mastoid air cells.  No evidence of calvarial fracture.  IMPRESSION: No evidence of acute intracranial abnormality.   Electronically Signed   By: Charline Bills M.D.   On: 02/08/2014 23:33     EKG Interpretation   Date/Time:  Tuesday February 08 2014 18:26:58 EDT Ventricular Rate:  81 PR Interval:  178 QRS Duration: 82 QT Interval:  356 QTC Calculation: 413 R Axis:   38 Text Interpretation:  Normal sinus rhythm Cannot rule out Anterior infarct  , age undetermined Abnormal ECG No prior Confirmed by Gwendolyn Grant  MD, Kamilo Och  ([id number]) on 02/08/2014 9:27:39 PM      MDM   Final diagnoses:  Chest pain, unspecified chest pain type  Acute nonintractable headache, unspecified headache type     48 year old female here with multiple complaints. Chest pain: Began roughly hour prior to arrival. Described as right central pressure without radiation. Worse with moving around, better with rest no history of chest pain previously. No history of coronary artery disease, cardiac cath. Patient is 48 and has high blood pressure but no other risk factors. EKG is normal. Serial troponins normal. I discussed with her admission for stress test in followup. She rather followup. I feel this is appropriate. Given resource guide and number for [place] [facility]. Headache: Gradual onset 4 hours ago while at rest. No thunderclap onset. Described as tension and bandlike pressure around her entire head. No vision changes, fever, vomiting. Has had some mild nausea. No history of headaches. Head CT normal. Headache resolved with her headache cocktail. No concern for aneurysm. Stable for discharge.    Elwin Mocha, MD 02/09/14 [id number]

## 2014-02-09 NOTE — Discharge Instructions (Signed)
Chest Pain (Nonspecific) °It is often hard to give a specific diagnosis for the cause of chest pain. There is always a chance that your pain could be related to something serious, such as a heart attack or a blood clot in the lungs. You need to follow up with your health care provider for further evaluation. °CAUSES  °· Heartburn. °· Pneumonia or bronchitis. °· Anxiety or stress. °· Inflammation around your heart (pericarditis) or lung (pleuritis or pleurisy). °· A blood clot in the lung. °· A collapsed lung (pneumothorax). It can develop suddenly on its own (spontaneous pneumothorax) or from trauma to the chest. °· Shingles infection (herpes zoster virus). °The chest wall is composed of bones, muscles, and cartilage. Any of these can be the source of the pain. °· The bones can be bruised by injury. °· The muscles or cartilage can be strained by coughing or overwork. °· The cartilage can be affected by inflammation and become sore (costochondritis). °DIAGNOSIS  °Lab tests or other studies may be needed to find the cause of your pain. Your health care provider may have you take a test called an ambulatory electrocardiogram (ECG). An ECG records your heartbeat patterns over a 24-hour period. You may also have other tests, such as: °· Transthoracic echocardiogram (TTE). During echocardiography, sound waves are used to evaluate how blood flows through your heart. °· Transesophageal echocardiogram (TEE). °· Cardiac monitoring. This allows your health care provider to monitor your heart rate and rhythm in real time. °· Holter monitor. This is a portable device that records your heartbeat and can help diagnose heart arrhythmias. It allows your health care provider to track your heart activity for several days, if needed. °· Stress tests by exercise or by giving medicine that makes the heart beat faster. °TREATMENT  °· Treatment depends on what may be causing your chest pain. Treatment may include: °¨ Acid blockers for  heartburn. °¨ Anti-inflammatory medicine. °¨ Pain medicine for inflammatory conditions. °¨ Antibiotics if an infection is present. °· You may be advised to change lifestyle habits. This includes stopping smoking and avoiding alcohol, caffeine, and chocolate. °· You may be advised to keep your head raised (elevated) when sleeping. This reduces the chance of acid going backward from your stomach into your esophagus. °Most of the time, nonspecific chest pain will improve within 2-3 days with rest and mild pain medicine.  °HOME CARE INSTRUCTIONS  °· If antibiotics were prescribed, take them as directed. Finish them even if you start to feel better. °· For the next few days, avoid physical activities that bring on chest pain. Continue physical activities as directed. °· Do not use any tobacco products, including cigarettes, chewing tobacco, or electronic cigarettes. °· Avoid drinking alcohol. °· Only take medicine as directed by your health care provider. °· Follow your health care provider's suggestions for further testing if your chest pain does not go away. °· Keep any follow-up appointments you made. If you do not go to an appointment, you could develop lasting (chronic) problems with pain. If there is any problem keeping an appointment, call to reschedule. °SEEK MEDICAL CARE IF:  °· Your chest pain does not go away, even after treatment. °· You have a rash with blisters on your chest. °· You have a fever. °SEEK IMMEDIATE MEDICAL CARE IF:  °· You have increased chest pain or pain that spreads to your arm, neck, jaw, back, or abdomen. °· You have shortness of breath. °· You have an increasing cough, or you cough   up blood.  You have severe back or abdominal pain.  You feel nauseous or vomit.  You have severe weakness.  You faint.  You have chills. This is an emergency. Do not wait to see if the pain will go away. Get medical help at once. Call your local emergency services (911 in [place] Do not drive  yourself to the hospital. MAKE SURE YOU:   Understand these instructions.  Will watch your condition.  Will get help right away if you are not doing well or get worse. Document Released: 02/27/2005 Document Revised: 05/25/2013 Document Reviewed: 12/24/2007 [facility] Patient Information 2015 [place], [place]. This information is not intended to replace advice given to you by your health care provider. Make sure you discuss any questions you have with your health care provider.  General Headache Without Cause A headache is pain or discomfort felt around the head or neck area. The specific cause of a headache may not be found. There are many causes and types of headaches. A few common ones are:  Tension headaches.  Migraine headaches.  Cluster headaches.  Chronic daily headaches. HOME CARE INSTRUCTIONS   Keep all follow-up appointments with your caregiver or any specialist referral.  Only take over-the-counter or prescription medicines for pain or discomfort as directed by your caregiver.  Lie down in a dark, quiet room when you have a headache.  Keep a headache journal to find out what may trigger your migraine headaches. For example, write down:  What you eat and drink.  How much sleep you get.  Any change to your diet or medicines.  Try massage or other relaxation techniques.  Put ice packs or heat on the head and neck. Use these 3 to 4 times per day for 15 to 20 minutes each time, or as needed.  Limit stress.  Sit up straight, and do not tense your muscles.  Quit smoking if you smoke.  Limit alcohol use.  Decrease the amount of caffeine you drink, or stop drinking caffeine.  Eat and sleep on a regular schedule.  Get 7 to 9 hours of sleep, or as recommended by your caregiver.  Keep lights dim if bright lights bother you and make your headaches worse. SEEK MEDICAL CARE IF:   You have problems with the medicines you were prescribed.  Your medicines are not  working.  You have a change from the usual headache.  You have nausea or vomiting. SEEK IMMEDIATE MEDICAL CARE IF:   Your headache becomes severe.  You have a fever.  You have a stiff neck.  You have loss of vision.  You have muscular weakness or loss of muscle control.  You start losing your balance or have trouble walking.  You feel faint or pass out.  You have severe symptoms that are different from your first symptoms. MAKE SURE YOU:   Understand these instructions.  Will watch your condition.  Will get help right away if you are not doing well or get worse. Document Released: 05/20/2005 Document Revised: 08/12/2011 Document Reviewed: 06/05/2011 [facility] Patient Information 2015 [place], [place]. This information is not intended to replace advice given to you by your health care provider. Make sure you discuss any questions you have with your health care provider.   Emergency Department Resource Guide 1) Find a Doctor and Pay Out of Pocket Although you won't have to find out who is covered by your insurance plan, it is a good idea to ask around and get recommendations. You will then need  to call the office and see if the doctor you have chosen will accept you as a new patient and what types of options they offer for patients who are self-pay. Some doctors offer discounts or will set up payment plans for their patients who do not have insurance, but you will need to ask so you aren't surprised when you get to your appointment.  2) Contact Your Local Health Department Not all health departments have doctors that can see patients for sick visits, but many do, so it is worth a call to see if yours does. If you don't know where your local health department is, you can check in your phone book. The CDC also has a tool to help you locate your state's health department, and many state websites also have listings of all of their local health departments.  3) Find a [facility] If your illness is not likely to be very severe or complicated, you may want to try a walk in clinic. These are popping up all over the country in pharmacies, drugstores, and shopping centers. They're usually staffed by nurse practitioners or physician assistants that have been trained to treat common illnesses and complaints. They're usually fairly quick and inexpensive. However, if you have serious medical issues or chronic medical problems, these are probably not your best option.  No Primary Care Doctor: - Call Health Connect at  [phone] - they can help you locate a primary care doctor that  accepts your insurance, provides certain services, etc. - Physician Referral Service- [phone]  Chronic Pain Problems: Organization         Address  Phone   Notes  [facility]  [phone] Patients need to be referred by their primary care doctor.   Medication Assistance: Organization         Address  Phone   Notes  [facility] Medication [facility] [address] [place], [place] [address] [phone] --Must be a resident of [facility] -- Must have NO insurance coverage whatsoever (no Medicaid/ Medicare, etc.) -- The pt. MUST have a primary care doctor that directs their care regularly and follows them in the community   MedAssist  [phone]   [employer]  [phone]    Agencies that provide inexpensive medical care: Organization         Address  Phone   Notes  [place]  [phone]   [facility]    [phone]   [facility] [place], [place] [address] [phone]   [place] [address], [place] [phone]   Planned Parenthood    [phone]   [facility]    [phone]   [place] and [address], [place] Phone:  [phone], Fax:  [phone] Hours  of Operation:  9 am - 6 pm, M-F.  Also accepts Medicaid/Medicare and self-pay.  [facility] for [place] [place], [address], [place] Phone: [phone], Fax: [phone]. Hours of Operation:  8:30 am - 5:30 pm, M-F.  Also accepts Medicaid and self-pay.  [facility] [address] [place] Phone: [phone]   [place], [place], [place] [phone], Ext. [phone] Mondays & Thursdays: 7-9 AM.  First 15 patients are seen on a first come, first serve basis.  [place] Providers:  Organization         Address  Phone   Notes  [facility] [address], [place] [phone] Also accepts self-pay patients.  [facility] [address], [place]  [phone]   [place], [address], [place] [phone]   [facility] [address], [place] [phone]   Lucianne Lei [address], [place]   [phone] Only accepts [place] Access [place] patients after they have their name applied to their card.   Self-Pay (no insurance) in [facility]:  Organization         Address  Phone   Notes  Sickle Cell Patients, [facility] [place] [phone]   [facility] [place] [phone]   [facility] [place]  [place], [place], [place] [phone]   [facility]/Dr. Osei-Bonsu  [address] [place] or [address], [place] [phone] Phone number for both [place] and [place] locations is the same.  Urgent Medical and [facility] [address] [place] [phone]   [facility] [address] [place] or [address] [phone]   [facility] [address], [place] [phone], phone; [phone], fax Sees patients 1st and 3rd Saturday of every month.  Must not qualify for public or private insurance (i.e. Medicaid, Medicare, [place] Health Choice, Veterans' Benefits)  Household income should be no more than 200% of the poverty level The clinic cannot treat you if you are pregnant or think you are pregnant  Sexually transmitted diseases are not treated at the clinic.    Dental Care: Organization         Address  Phone  Notes  [facility] of [facility] [place] [phone] Accepts children up to age 60 who are enrolled in [place] or [place]; pregnant women with a Medicaid card; and children who have applied for Medicaid or [place] Health Choice, but were declined, whose parents can pay a reduced fee at time of service.  [facility] Department of [facility]  [address], [place] [phone] Accepts children up to age 26 who are enrolled in [place] or [place]; pregnant women with a Medicaid card; and children who have applied for Medicaid or [place] Health Choice, but were declined, whose parents can pay a reduced fee at time of service.  [place] Adult [facility] Access PROGRAM  [place] [phone] Patients are seen by appointment only. Walk-ins are not accepted. [place] will see patients 69 years of age and older. Monday - Tuesday (8am-5pm) Most Wednesdays (8:30-5pm) $30 per visit, cash only  [facility] Adult Dental Access PROGRAM  [address], [facility] [phone] Patients are seen by appointment only. Walk-ins are not accepted. [place] will see patients 30 years of age and older. One Wednesday Evening (Monthly: Volunteer Based).  $30 per visit, cash only  [place]  [phone] for adults; Children under age 44, call Graduate  Pediatric Dentistry at [phone]. Children aged 24-14, please call [phone] to request a  pediatric application.  Dental services are provided in all areas of dental care including fillings, crowns and bridges, complete and partial dentures, implants, gum treatment, root canals, and extractions. Preventive care is also provided. Treatment is provided to both adults and children. Patients are selected via a lottery and there is often a waiting list.   [facility] [address] [place]  [phone] [email or web address]   [facility] [address] [place], [place] [phone], Ext. [phone] Second and Fourth Thursday of each month, opens at 6:30 AM; Clinic ends at 9 AM.  Patients are seen on a first-come first-served basis, and a limited number are seen during each clinic.   [facility]  [address]ard Danker [place], [place] [phone]   Eligibility Requirements You must have lived in [place], [place], or [place] counties for at least the last three months.   You cannot be eligible for state or federal sponsored [employer], including [employer], [place], or [employer].   You generally cannot be eligible for healthcare insurance through your employer.    How to apply: Eligibility screenings are held every Tuesday and Wednesday afternoon from 1:00 pm until 4:00 pm. You do not need an appointment for the interview!  [facility] [address], [place], [place]   [place]  Impact Department  [place]  [phone]    Behavioral Health Resources in the Community: Intensive Outpatient Programs Organization         Address  Phone  Notes  Lavina Elmo. [address], [place], [place] [phone]   [facility] Outpatient [address] [place], [place]   ADS: Alcohol [facility] [address], [place], [place]   [place] [address],  [place], [place] or [phone]   Substance Abuse Resources Organization         Address  Phone  Notes  Alcohol and Drug Services  [phone]   [place]  [phone]   [facility] [place]   Chinita Pester  [phone]   Residential & Outpatient Substance Abuse Program  [phone]   Psychological Services Organization         Address  Phone  Notes  [facility] [place]  [place]  [phone]   [place] [address] [place] or [phone]    Mobile Crisis Teams Organization         Address  Phone  Notes  [facility], Mobile Crisis Care Unit  [phone]   [facility]  [address] [place], [place]   [place] [address] [place] [phone]    Self-Help/Support Groups Organization         Address  Phone             Notes  [place]. of [place] - variety of support groups  [place] Call for more information  Narcotics Anonymous (NA), [facility] [address], [employer] [place]  2 meetings at this location   Special educational needs teacher         Address  Phone  Notes  [facility] [place],    [place]  [place]  [place], [place],  [place], [place] [phone]   [facility] [address] [place], [place] [phone] Admissions: 8am-3pm M-F  [facility] [address]    [place], [place] [phone]   [facility] [address] [place], [place], [place] [phone]   [facility] [address],  [place], [place] [phone]   [facility] - Intensive Outpatient [address] Laurell Joseph[address], [place], [place] [phone]   [facility] [address] [place].,  [place], [place] [phone] or [phone]   [facility] Services (RTS) [address] [place], [place] [phone] Accepts Medicaid  [facility] [place] [address],  [place] [phone] Substance Abuse/Addiction Treatment   [facility] Organization         Address  Phone  Notes  [facility]  [phone]   Angie Fava, PhD [address] Er[address] Kn[address] [place], [place]   [phone] or [phone]   [facility]   [address] [place], [place] [phone]   [facility] [address] [place], [place] [phone] Insurance/Medicaid/sponsorship through [facility] and Families [address]                                    [place], [place] [phone] Therapy/tele-psych/case  [facility] [address], [place] [phone]    Dr. Lolly Mustache  [phone]   [facility]  [employer] [facility] Dept. 1) [address], [place] 2) [address], [place] 3)  [address], [place] [phone]  [phone]   [facility] Child Abuse Hotline ([phone] or [phone] (After Hours)

## 2014-03-21 ENCOUNTER — Encounter (HOSPITAL_COMMUNITY): Payer: Self-pay | Admitting: Emergency Medicine

## 2014-03-21 ENCOUNTER — Emergency Department (HOSPITAL_COMMUNITY)
Admission: EM | Admit: 2014-03-21 | Discharge: 2014-03-21 | Disposition: A | Discharge status: Home / Self Care | Payer: No Typology Code available for payment source | Attending: Emergency Medicine | Admitting: Emergency Medicine

## 2014-03-21 DIAGNOSIS — Z880 Allergy status to penicillin: Secondary | ICD-10-CM | POA: Insufficient documentation

## 2014-03-21 DIAGNOSIS — J069 Acute upper respiratory infection, unspecified: Secondary | ICD-10-CM | POA: Insufficient documentation

## 2014-03-21 DIAGNOSIS — H9209 Otalgia, unspecified ear: Secondary | ICD-10-CM | POA: Insufficient documentation

## 2014-03-21 DIAGNOSIS — R197 Diarrhea, unspecified: Secondary | ICD-10-CM | POA: Insufficient documentation

## 2014-03-21 DIAGNOSIS — Z91040 Latex allergy status: Secondary | ICD-10-CM | POA: Insufficient documentation

## 2014-03-21 DIAGNOSIS — I10 Essential (primary) hypertension: Secondary | ICD-10-CM | POA: Insufficient documentation

## 2014-03-21 LAB — RAPID STREP SCREEN (MED CTR MEBANE ONLY): Streptococcus, Group A Screen (Direct): NEGATIVE

## 2014-03-21 MED ORDER — GUAIFENESIN 100 MG/5ML PO LIQD: 100.0000 mg | ORAL | Status: DC | PRN

## 2014-03-21 MED ORDER — FLUTICASONE PROPIONATE 50 MCG/ACT NA SUSP: 2.0000 | Freq: Every day | NASAL | Status: DC

## 2014-03-21 MED ORDER — ALBUTEROL SULFATE HFA 108 (90 BASE) MCG/ACT IN AERS
2.0000 | INHALATION_SPRAY | RESPIRATORY_TRACT | Status: DC | PRN
  Administered 2014-03-21: 2 via RESPIRATORY_TRACT
  Filled 2014-03-21: qty 6.7

## 2014-03-21 NOTE — Discharge Instructions (Signed)
Upper Respiratory Infection, Adult An upper respiratory infection (URI) is also sometimes known as the common cold. The upper respiratory tract includes the nose, sinuses, throat, trachea, and bronchi. Bronchi are the airways leading to the lungs. Most people improve within 1 week, but symptoms can last up to 2 weeks. A residual cough may last even longer.  CAUSES Many different viruses can infect the tissues lining the upper respiratory tract. The tissues become irritated and inflamed and often become very moist. Mucus production is also common. A cold is contagious. You can easily spread the virus to others by oral contact. This includes kissing, sharing a glass, coughing, or sneezing. Touching your mouth or nose and then touching a surface, which is then touched by another person, can also spread the virus. SYMPTOMS  Symptoms typically develop 1 to 3 days after you come in contact with a cold virus. Symptoms vary from person to person. They may include:  Runny nose.  Sneezing.  Nasal congestion.  Sinus irritation.  Sore throat.  Loss of voice (laryngitis).  Cough.  Fatigue.  Muscle aches.  Loss of appetite.  Headache.  Low-grade fever. DIAGNOSIS  You might diagnose your own cold based on familiar symptoms, since most people get a cold 2 to 3 times a year. Your caregiver can confirm this based on your exam. Most importantly, your caregiver can check that your symptoms are not due to another disease such as strep throat, sinusitis, pneumonia, asthma, or epiglottitis. Blood tests, throat tests, and X-rays are not necessary to diagnose a common cold, but they may sometimes be helpful in excluding other more serious diseases. Your caregiver will decide if any further tests are required. RISKS AND COMPLICATIONS  You may be at risk for a more severe case of the common cold if you smoke cigarettes, have chronic heart disease (such as heart failure) or lung disease (such as asthma), or if  you have a weakened immune system. The very young and very old are also at risk for more serious infections. Bacterial sinusitis, middle ear infections, and bacterial pneumonia can complicate the common cold. The common cold can worsen asthma and chronic obstructive pulmonary disease (COPD). Sometimes, these complications can require emergency medical care and may be life-threatening. PREVENTION  The best way to protect against getting a cold is to practice good hygiene. Avoid oral or hand contact with people with cold symptoms. Wash your hands often if contact occurs. There is no clear evidence that vitamin C, vitamin E, echinacea, or exercise reduces the chance of developing a cold. However, it is always recommended to get plenty of rest and practice good nutrition. TREATMENT  Treatment is directed at relieving symptoms. There is no cure. Antibiotics are not effective, because the infection is caused by a virus, not by bacteria. Treatment may include:  Increased fluid intake. Sports drinks offer valuable electrolytes, sugars, and fluids.  Breathing heated mist or steam (vaporizer or shower).  Eating chicken soup or other clear broths, and maintaining good nutrition.  Getting plenty of rest.  Using gargles or lozenges for comfort.  Controlling fevers with ibuprofen or acetaminophen as directed by your caregiver.  Increasing usage of your inhaler if you have asthma. Zinc gel and zinc lozenges, taken in the first 24 hours of the common cold, can shorten the duration and lessen the severity of symptoms. Pain medicines may help with fever, muscle aches, and throat pain. A variety of non-prescription medicines are available to treat congestion and runny nose. Your caregiver   can make recommendations and may suggest nasal or lung inhalers for other symptoms.  HOME CARE INSTRUCTIONS   Only take over-the-counter or prescription medicines for pain, discomfort, or fever as directed by your  caregiver.  Use a warm mist humidifier or inhale steam from a shower to increase air moisture. This may keep secretions moist and make it easier to breathe.  Drink enough water and fluids to keep your urine clear or pale yellow.  Rest as needed.  Return to work when your temperature has returned to normal or as your caregiver advises. You may need to stay home longer to avoid infecting others. You can also use a face mask and careful hand washing to prevent spread of the virus. SEEK MEDICAL CARE IF:   After the first few days, you feel you are getting worse rather than better.  You need your caregiver's advice about medicines to control symptoms.  You develop chills, worsening shortness of breath, or brown or red sputum. These may be signs of pneumonia.  You develop yellow or brown nasal discharge or pain in the face, especially when you bend forward. These may be signs of sinusitis.  You develop a fever, swollen neck glands, pain with swallowing, or white areas in the back of your throat. These may be signs of strep throat. SEEK IMMEDIATE MEDICAL CARE IF:   You have a fever.  You develop severe or persistent headache, ear pain, sinus pain, or chest pain.  You develop wheezing, a prolonged cough, cough up blood, or have a change in your usual mucus (if you have chronic lung disease).  You develop sore muscles or a stiff neck. Document Released: 11/13/2000 Document Revised: 08/12/2011 Document Reviewed: 08/25/2013 [employer] Patient Information 2015 [employer]. This information is not intended to replace advice given to you by your health care provider. Make sure you discuss any questions you have with your health care provider.  

## 2014-03-21 NOTE — ED Provider Notes (Signed)
CSN: [id number]     Arrival date & time 03/21/14  1318 History  This chart was scribed for non-physician practitioner, Joycie PeekBenjamin Cartner, PA-C, working with Samuel JesterKathleen McManus, DO by Charline BillsEssence Howell, ED Scribe. This patient was seen in room TR06C/TR06C and the patient's care was started at 2:31 PM.    Chief Complaint  Patient presents with  . Sore Throat  . URI   The history is provided by the patient. No language interpreter was used.   HPI Comments: Sara Sexton is a 48 y.o. female, with a h/o asthma, who presents to the Emergency Department complaining of constant sore throat onset 4 days ago. She reports associated L ear pain, nasal discomfort, subjective fever, chills, diarrhea. She denies sneezing, cough, trouble swallowing, chest pain, SOB, rash, nausea, vomiting, abdominal pain. Pt's son has had similar symptoms for 2 weeks. She has tried Tylenol and Nyquil with mild relief.   Past Medical History  Diagnosis Date  . Hypertension    History reviewed. No pertinent past surgical history. History reviewed. No pertinent family history. History  Substance Use Topics  . Smoking status: Never Smoker   . Smokeless tobacco: Not on file  . Alcohol Use: No   OB History   Grav Para Term Preterm Abortions TAB SAB Ect Mult Living                 Review of Systems  Constitutional: Positive for fever (subjective) and chills.  HENT: Positive for ear pain. Negative for sneezing and trouble swallowing.   Respiratory: Negative for cough and shortness of breath.   Cardiovascular: Negative for chest pain.  Gastrointestinal: Positive for diarrhea. Negative for nausea, vomiting and abdominal pain.  Skin: Negative for rash.   Allergies  Latex; Penicillins; and Vagisil anti-itch medicated  Home Medications   Prior to Admission medications   Medication Sig Start Date End Date Taking? Authorizing Provider  Multiple Vitamin (MULTIVITAMIN WITH MINERALS) TABS tablet Take 1 tablet by mouth daily.    Yes Historical Provider, MD   Triage Vitals: BP 127/75  Pulse 81  Temp(Src) 98.7 F (37.1 C) (Oral)  Resp 17  Ht 5\' 6"  (1.676 m)  Wt 258 lb (117.028 kg)  BMI 41.66 kg/m2  SpO2 97% Physical Exam  Nursing note and vitals reviewed. Constitutional: She is oriented to person, place, and time. She appears well-developed and well-nourished. No distress.  HENT:  Head: Normocephalic and atraumatic.  Right Ear: Tympanic membrane normal.  Left Ear: Tympanic membrane normal.  Nose: Nose normal.  Mouth/Throat: Oropharynx is clear and moist. No oropharyngeal exudate or tonsillar abscesses.  Eyes: Conjunctivae and EOM are normal.  Neck: Neck supple. No tracheal deviation present.  Cardiovascular: Normal rate.   Pulmonary/Chest: Effort normal. No respiratory distress.  Abdominal: There is no tenderness.  Musculoskeletal: Normal range of motion.  Lymphadenopathy:    She has cervical adenopathy (anterior).  Neurological: She is alert and oriented to person, place, and time.  Skin: Skin is warm and dry.  Psychiatric: She has a normal mood and affect. Her behavior is normal.   ED Course  Procedures (including critical care time) DIAGNOSTIC STUDIES: Oxygen Saturation is 97% on RA, normal by my interpretation.    COORDINATION OF CARE: 2:37 PM-Discussed treatment plan which includes strep screen, Flonase and inhaler if needed with pt at bedside and pt agreed to plan.   Labs Review Labs Reviewed  RAPID STREP SCREEN  CULTURE, GROUP A STREP   Imaging Review No results found.   EKG  Interpretation None      MDM   Final diagnoses:  URI (upper respiratory infection)    BP 127/75  Pulse 81  Temp(Src) 98.7 F (37.1 C) (Oral)  Resp 17  Ht 5\' 6"  (1.676 m)  Wt 258 lb (117.028 kg)  BMI 41.66 kg/m2  SpO2 97%   I personally performed the services described in this documentation, which was scribed in my presence. The recorded information has been reviewed and is  accurate.    Fayrene HelperBowie Hawkin Charo, PA-C 03/21/14 [phone]

## 2014-03-21 NOTE — ED Notes (Signed)
Pt c/o sore throat and URI sx x 3 days

## 2014-03-22 NOTE — ED Provider Notes (Signed)
Medical screening examination/treatment/procedure(s) were performed by non-physician practitioner and as supervising physician I was immediately available for consultation/collaboration.   EKG Interpretation None        Faris Coolman, DO 03/22/14 2318 

## 2014-03-23 LAB — CULTURE, GROUP A STREP

## 2014-09-15 ENCOUNTER — Emergency Department (HOSPITAL_COMMUNITY): Payer: No Typology Code available for payment source

## 2014-09-15 ENCOUNTER — Emergency Department (HOSPITAL_COMMUNITY)
Admission: EM | Admit: 2014-09-15 | Discharge: 2014-09-15 | Disposition: A | Discharge status: Home / Self Care | Payer: No Typology Code available for payment source | Attending: Emergency Medicine | Admitting: Emergency Medicine

## 2014-09-15 ENCOUNTER — Encounter (HOSPITAL_COMMUNITY): Payer: Self-pay | Admitting: Emergency Medicine

## 2014-09-15 DIAGNOSIS — Z79899 Other long term (current) drug therapy: Secondary | ICD-10-CM | POA: Insufficient documentation

## 2014-09-15 DIAGNOSIS — H748X2 Other specified disorders of left middle ear and mastoid: Secondary | ICD-10-CM | POA: Insufficient documentation

## 2014-09-15 DIAGNOSIS — J45901 Unspecified asthma with (acute) exacerbation: Secondary | ICD-10-CM | POA: Insufficient documentation

## 2014-09-15 DIAGNOSIS — R05 Cough: Secondary | ICD-10-CM

## 2014-09-15 DIAGNOSIS — J029 Acute pharyngitis, unspecified: Secondary | ICD-10-CM

## 2014-09-15 DIAGNOSIS — Z91040 Latex allergy status: Secondary | ICD-10-CM | POA: Insufficient documentation

## 2014-09-15 DIAGNOSIS — I10 Essential (primary) hypertension: Secondary | ICD-10-CM | POA: Insufficient documentation

## 2014-09-15 DIAGNOSIS — J069 Acute upper respiratory infection, unspecified: Secondary | ICD-10-CM | POA: Insufficient documentation

## 2014-09-15 DIAGNOSIS — Z7951 Long term (current) use of inhaled steroids: Secondary | ICD-10-CM | POA: Insufficient documentation

## 2014-09-15 DIAGNOSIS — Z880 Allergy status to penicillin: Secondary | ICD-10-CM | POA: Insufficient documentation

## 2014-09-15 DIAGNOSIS — R059 Cough, unspecified: Secondary | ICD-10-CM

## 2014-09-15 DIAGNOSIS — H9201 Otalgia, right ear: Secondary | ICD-10-CM | POA: Insufficient documentation

## 2014-09-15 HISTORY — DX: Unspecified asthma, uncomplicated: J45.909

## 2014-09-15 LAB — RAPID STREP SCREEN (MED CTR MEBANE ONLY): Streptococcus, Group A Screen (Direct): NEGATIVE

## 2014-09-15 MED ORDER — SALINE SPRAY 0.65 % NA SOLN: 1.0000 | NASAL | Status: DC | PRN

## 2014-09-15 MED ORDER — NAPROXEN 500 MG PO TABS: 500.0000 mg | ORAL_TABLET | Freq: Two times a day (BID) | ORAL | Status: DC

## 2014-09-15 MED ORDER — HYDROCODONE-ACETAMINOPHEN 7.5-325 MG/15ML PO SOLN: 15.0000 mL | Freq: Three times a day (TID) | ORAL | Status: DC | PRN

## 2014-09-15 MED ORDER — BENZONATATE 100 MG PO CAPS: 100.0000 mg | ORAL_CAPSULE | Freq: Three times a day (TID) | ORAL | Status: DC

## 2014-09-15 NOTE — Discharge Instructions (Signed)
Recommend saltwater gargles and plenty of fluid hydration. Take the medications prescribed to you for symptom relief. Follow-up with a primary doctor for a recheck of symptoms. Return to the emergency department as needed if symptoms worsen.  Upper Respiratory Infection, Adult An upper respiratory infection (URI) is also sometimes known as the common cold. The upper respiratory tract includes the nose, sinuses, throat, trachea, and bronchi. Bronchi are the airways leading to the lungs. Most people improve within 1 week, but symptoms can last up to 2 weeks. A residual cough may last even longer.  CAUSES Many different viruses can infect the tissues lining the upper respiratory tract. The tissues become irritated and inflamed and often become very moist. Mucus production is also common. A cold is contagious. You can easily spread the virus to others by oral contact. This includes kissing, sharing a glass, coughing, or sneezing. Touching your mouth or nose and then touching a surface, which is then touched by another person, can also spread the virus. SYMPTOMS  Symptoms typically develop 1 to 3 days after you come in contact with a cold virus. Symptoms vary from person to person. They may include:  Runny nose.  Sneezing.  Nasal congestion.  Sinus irritation.  Sore throat.  Loss of voice (laryngitis).  Cough.  Fatigue.  Muscle aches.  Loss of appetite.  Headache.  Low-grade fever. DIAGNOSIS  You might diagnose your own cold based on familiar symptoms, since most people get a cold 2 to 3 times a year. Your caregiver can confirm this based on your exam. Most importantly, your caregiver can check that your symptoms are not due to another disease such as strep throat, sinusitis, pneumonia, asthma, or epiglottitis. Blood tests, throat tests, and X-rays are not necessary to diagnose a common cold, but they may sometimes be helpful in excluding other more serious diseases. Your caregiver will  decide if any further tests are required. RISKS AND COMPLICATIONS  You may be at risk for a more severe case of the common cold if you smoke cigarettes, have chronic heart disease (such as heart failure) or lung disease (such as asthma), or if you have a weakened immune system. The very young and very old are also at risk for more serious infections. Bacterial sinusitis, middle ear infections, and bacterial pneumonia can complicate the common cold. The common cold can worsen asthma and chronic obstructive pulmonary disease (COPD). Sometimes, these complications can require emergency medical care and may be life-threatening. PREVENTION  The best way to protect against getting a cold is to practice good hygiene. Avoid oral or hand contact with people with cold symptoms. Wash your hands often if contact occurs. There is no clear evidence that vitamin C, vitamin E, echinacea, or exercise reduces the chance of developing a cold. However, it is always recommended to get plenty of rest and practice good nutrition. TREATMENT  Treatment is directed at relieving symptoms. There is no cure. Antibiotics are not effective, because the infection is caused by a virus, not by bacteria. Treatment may include:  Increased fluid intake. Sports drinks offer valuable electrolytes, sugars, and fluids.  Breathing heated mist or steam (vaporizer or shower).  Eating chicken soup or other clear broths, and maintaining good nutrition.  Getting plenty of rest.  Using gargles or lozenges for comfort.  Controlling fevers with ibuprofen or acetaminophen as directed by your caregiver.  Increasing usage of your inhaler if you have asthma. Zinc gel and zinc lozenges, taken in the first 24 hours of the  common cold, can shorten the duration and lessen the severity of symptoms. Pain medicines may help with fever, muscle aches, and throat pain. A variety of non-prescription medicines are available to treat congestion and runny nose.  Your caregiver can make recommendations and may suggest nasal or lung inhalers for other symptoms.  HOME CARE INSTRUCTIONS   Only take over-the-counter or prescription medicines for pain, discomfort, or fever as directed by your caregiver.  Use a warm mist humidifier or inhale steam from a shower to increase air moisture. This may keep secretions moist and make it easier to breathe.  Drink enough water and fluids to keep your urine clear or pale yellow.  Rest as needed.  Return to work when your temperature has returned to normal or as your caregiver advises. You may need to stay home longer to avoid infecting others. You can also use a face mask and careful hand washing to prevent spread of the virus. SEEK MEDICAL CARE IF:   After the first few days, you feel you are getting worse rather than better.  You need your caregiver's advice about medicines to control symptoms.  You develop chills, worsening shortness of breath, or brown or red sputum. These may be signs of pneumonia.  You develop yellow or brown nasal discharge or pain in the face, especially when you bend forward. These may be signs of sinusitis.  You develop a fever, swollen neck glands, pain with swallowing, or white areas in the back of your throat. These may be signs of strep throat. SEEK IMMEDIATE MEDICAL CARE IF:   You have a fever.  You develop severe or persistent headache, ear pain, sinus pain, or chest pain.  You develop wheezing, a prolonged cough, cough up blood, or have a change in your usual mucus (if you have chronic lung disease).  You develop sore muscles or a stiff neck. Document Released: 11/13/2000 Document Revised: 08/12/2011 Document Reviewed: 08/25/2013 [facility] Patient Information 2015 [place]. This information is not intended to replace advice given to you by your health care provider. Make sure you discuss any questions you have with your health care provider.   Cough, Adult  A cough  is a reflex that helps clear your throat and airways. It can help heal the body or may be a reaction to an irritated airway. A cough may only last 2 or 3 weeks (acute) or may last more than 8 weeks (chronic).  CAUSES Acute cough:  Viral or bacterial infections. Chronic cough:  Infections.  Allergies.  Asthma.  Post-nasal drip.  Smoking.  Heartburn or acid reflux.  Some medicines.  Chronic lung problems (COPD).  Cancer. SYMPTOMS   Cough.  Fever.  Chest pain.  Increased breathing rate.  High-pitched whistling sound when breathing (wheezing).  Colored mucus that you cough up (sputum). TREATMENT   A bacterial cough may be treated with antibiotic medicine.  A viral cough must run its course and will not respond to antibiotics.  Your caregiver may recommend other treatments if you have a chronic cough. HOME CARE INSTRUCTIONS   Only take over-the-counter or prescription medicines for pain, discomfort, or fever as directed by your caregiver. Use cough suppressants only as directed by your caregiver.  Use a cold steam vaporizer or humidifier in your bedroom or home to help loosen secretions.  Sleep in a semi-upright position if your cough is worse at night.  Rest as needed.  Stop smoking if you smoke. SEEK IMMEDIATE MEDICAL CARE IF:   You have  pus in your sputum.  Your cough starts to worsen.  You cannot control your cough with suppressants and are losing sleep.  You begin coughing up blood.  You have difficulty breathing.  You develop pain which is getting worse or is uncontrolled with medicine.  You have a fever. MAKE SURE YOU:   Understand these instructions.  Will watch your condition.  Will get help right away if you are not doing well or get worse. Document Released: 11/16/2010 Document Revised: 08/12/2011 Document Reviewed: 11/16/2010 [facility] Patient Information 2015 [place], [place]. This information is not intended to replace advice given  to you by your health care provider. Make sure you discuss any questions you have with your health care provider.  Salt Water Gargle This solution will help make your mouth and throat feel better. HOME CARE INSTRUCTIONS   Mix 1 teaspoon of salt in 8 ounces of warm water.  Gargle with this solution as much or often as you need or as directed. Swish and gargle gently if you have any sores or wounds in your mouth.  Do not swallow this mixture. Document Released: 02/22/2004 Document Revised: 08/12/2011 Document Reviewed: 07/15/2008 [facility] Patient Information 2015 [place], [place]. This information is not intended to replace advice given to you by your health care provider. Make sure you discuss any questions you have with your health care provider.

## 2014-09-15 NOTE — ED Notes (Signed)
Pt c/o cough, fever and sore throat x 1 week. Pt sts she has tried nyquil and theraflu without relief. A&Ox4. Denies chest pain, SOB, N/V. Ambulatory.

## 2014-09-15 NOTE — ED Notes (Signed)
Pt reports sore throat and productive cough with yellowish sputum x 10 days.  Reports she has tried nyquil and theraflu without relief.

## 2014-09-15 NOTE — ED Provider Notes (Signed)
CSN: [id number]     Arrival date & time 09/15/14  2108 History   This chart was scribed for non-physician practitioner, Antony MaduraKelly Marisal Swarey, PA-C working with Gerhard Munchobert Lockwood, MD0, by Jarvis Morganaylor Ferguson, ED Scribe. This patient was seen in room WTR6/WTR6 and the patient's care was started at 10:10 PM.    Chief Complaint  Patient presents with  . Cough  . Sore Throat    Patient is a 49 y.o. female presenting with cough and pharyngitis. The history is provided by the patient. No language interpreter was used.  Cough Cough characteristics:  Productive Sputum characteristics: "pus like" Severity:  Moderate Duration:  10 days Timing:  Intermittent Progression:  Worsening Relieved by:  Nothing Ineffective treatments: Nyquil and Theraflu. Associated symptoms: chills, diaphoresis, ear pain, fever, myalgias, shortness of breath and sore throat   Associated symptoms: no chest pain and no sinus congestion   Sore Throat Associated symptoms include shortness of breath. Pertinent negatives include no chest pain.    HPI Comments: Garrel RidgelSheryl Deutschman is a 49 y.o. female with a h/o HTN, asthma who presents to the Emergency Department complaining of intermittent, moderate, cough for 10 days. She is having associated sore throat, fever of 101.2 F at night, diaphoresis, chills, mild SOB, generalized body aches, and right otalgia. Pt has a h/o asthma so she states sh  has intermittent SOB at baseline. She states the cough is productive and states the sputum tastes "pus like". She has taken Nyquil and Theraflu with no relief. She denies drooling, difficulty swallowing, congestion, nausea, vomiting or chest pain.   Past Medical History  Diagnosis Date  . Hypertension   . Asthma    History reviewed. No pertinent past surgical history. No family history on file. History  Substance Use Topics  . Smoking status: Never Smoker   . Smokeless tobacco: Not on file  . Alcohol Use: No   OB History    No data available       Review of Systems  Constitutional: Positive for fever, chills and diaphoresis.  HENT: Positive for ear pain and sore throat.   Respiratory: Positive for cough and shortness of breath.   Cardiovascular: Negative for chest pain.  Gastrointestinal: Negative for nausea and vomiting.  Musculoskeletal: Positive for myalgias.  All other systems reviewed and are negative.   Allergies  Latex; Penicillins; and Vagisil anti-itch medicated  Home Medications   Prior to Admission medications   Medication Sig Start Date End Date Taking? Authorizing Provider  benzonatate (TESSALON) 100 MG capsule Take 1 capsule (100 mg total) by mouth every 8 (eight) hours. 09/15/14   Antony MaduraKelly Naya Ilagan, PA-C  fluticasone (FLONASE) 50 MCG/ACT nasal spray Place 2 sprays into both nostrils daily. 03/21/14   Fayrene HelperBowie Tran, PA-C  guaiFENesin (ROBITUSSIN) 100 MG/5ML liquid Take 5-10 mLs (100-200 mg total) by mouth every 4 (four) hours as needed for cough. 03/21/14   Fayrene HelperBowie Tran, PA-C  HYDROcodone-acetaminophen (HYCET) 7.5-325 mg/15 ml solution Take 15 mLs by mouth every 8 (eight) hours as needed for moderate pain or severe pain. 09/15/14   Antony MaduraKelly Bolivar Koranda, PA-C  Multiple Vitamin (MULTIVITAMIN WITH MINERALS) TABS tablet Take 1 tablet by mouth daily.    Historical Provider, MD  naproxen (NAPROSYN) 500 MG tablet Take 1 tablet (500 mg total) by mouth 2 (two) times daily. 09/15/14   Antony MaduraKelly Neida Ellegood, PA-C  sodium chloride (OCEAN) 0.65 % SOLN nasal spray Place 1 spray into both nostrils as needed for congestion. 09/15/14   Antony MaduraKelly Abdulwahab Demelo, PA-C   Triage Vitals:  BP 132/93 mmHg  Pulse 74  Temp(Src) 98.2 F (36.8 C) (Oral)  Resp 17  SpO2 100%  Physical Exam  Constitutional: She is oriented to person, place, and time. She appears well-developed and well-nourished. No distress.  HENT:  Head: Normocephalic and atraumatic.  Right Ear: Tympanic membrane, external ear and ear canal normal.  Left Ear: External ear and ear canal normal. A middle ear  effusion is present.  Nose: Nose normal.  Mouth/Throat: Uvula is midline and mucous membranes are normal. No trismus in the jaw. No uvula swelling. Posterior oropharyngeal erythema present. No oropharyngeal exudate or tonsillar abscesses.  Oropharynx clear. Mild posterior oropharyngeal erythema. Uvula midline. Patient tolerating secretions without difficulty.  Eyes: Conjunctivae and EOM are normal. Pupils are equal, round, and reactive to light. No scleral icterus.  Neck: Normal range of motion.  No stridor  Cardiovascular: Normal rate, regular rhythm and intact distal pulses.   Pulmonary/Chest: Effort normal and breath sounds normal. No respiratory distress. She has no wheezes. She has no rales.  Lungs clear bilaterally. No tachypnea or dyspnea. No accessory muscle use.  Musculoskeletal: Normal range of motion.  Neurological: She is alert and oriented to person, place, and time. She exhibits normal muscle tone. Coordination normal.  Skin: Skin is warm and dry. No rash noted. She is not diaphoretic. No erythema. No pallor.  Psychiatric: She has a normal mood and affect. Her behavior is normal.  Nursing note and vitals reviewed.   ED Course  Procedures (including critical care time)  DIAGNOSTIC STUDIES: Oxygen Saturation is 100% on RA, normal by my interpretation.    COORDINATION OF CARE: 10:20 PM- Will order CXR and rapid strep screen.  Pt advised of plan for treatment and pt agrees.  Labs Review Labs Reviewed  RAPID STREP SCREEN  CULTURE, GROUP A STREP    Imaging Review Dg Chest 2 View  09/15/2014   CLINICAL DATA:  Cough for 10 days.  No improvement.  EXAM: CHEST  2 VIEW  COMPARISON:  02/08/2014  FINDINGS: Normal heart size and mediastinal contours. No acute infiltrate or edema. No effusion or pneumothorax. No acute osseous findings. Cholecystectomy.  IMPRESSION: No active cardiopulmonary disease.   Electronically Signed   By: Marnee Spring M.D.   On: 09/15/2014 22:53     EKG  Interpretation None      MDM   Final diagnoses:  Cough  Viral URI  Pharyngitis    Pt CXR negative for acute infiltrate. Rapid strep negative. Patients symptoms are consistent with URI, likely viral etiology. No evidence of PTA. No trismus or stridor. Discussed that antibiotics are not indicated for viral infections. Pt will be discharged with symptomatic treatment. Patient verbalizes understanding and is agreeable with plan. Pt is hemodynamically stable and in NAD prior to dc. Return precautions given.  I personally performed the services described in this documentation, which was scribed in my presence. The recorded information has been reviewed and is accurate.   Filed Vitals:   09/15/14 2153  BP: 132/93  Pulse: 74  Temp: 98.2 F (36.8 C)  TempSrc: Oral  Resp: 17  SpO2: 100%    Antony Madura, PA-C 09/15/14 2324  Antony Madura, PA-C 09/15/14 2324  Gerhard Munch, MD 09/16/14 [phone]

## 2014-09-19 LAB — CULTURE, GROUP A STREP: Strep A Culture: NEGATIVE

## 2015-02-21 ENCOUNTER — Ambulatory Visit: Payer: Self-pay

## 2015-02-21 ENCOUNTER — Encounter: Payer: 59 | Admitting: Podiatry

## 2015-02-21 NOTE — Progress Notes (Signed)
This encounter was created in error - please disregard.

## 2015-08-20 ENCOUNTER — Emergency Department (HOSPITAL_COMMUNITY): Payer: No Typology Code available for payment source

## 2015-08-20 ENCOUNTER — Emergency Department (HOSPITAL_COMMUNITY)
Admission: EM | Admit: 2015-08-20 | Discharge: 2015-08-20 | Disposition: A | Discharge status: Home / Self Care | Payer: No Typology Code available for payment source | Attending: Emergency Medicine | Admitting: Emergency Medicine

## 2015-08-20 ENCOUNTER — Encounter (HOSPITAL_COMMUNITY): Payer: Self-pay | Admitting: Emergency Medicine

## 2015-08-20 DIAGNOSIS — Z880 Allergy status to penicillin: Secondary | ICD-10-CM | POA: Insufficient documentation

## 2015-08-20 DIAGNOSIS — I10 Essential (primary) hypertension: Secondary | ICD-10-CM | POA: Insufficient documentation

## 2015-08-20 DIAGNOSIS — Z91040 Latex allergy status: Secondary | ICD-10-CM | POA: Insufficient documentation

## 2015-08-20 DIAGNOSIS — Z79899 Other long term (current) drug therapy: Secondary | ICD-10-CM | POA: Insufficient documentation

## 2015-08-20 DIAGNOSIS — J069 Acute upper respiratory infection, unspecified: Secondary | ICD-10-CM | POA: Insufficient documentation

## 2015-08-20 DIAGNOSIS — J45901 Unspecified asthma with (acute) exacerbation: Secondary | ICD-10-CM | POA: Insufficient documentation

## 2015-08-20 LAB — RAPID STREP SCREEN (MED CTR MEBANE ONLY): STREPTOCOCCUS, GROUP A SCREEN (DIRECT): NEGATIVE

## 2015-08-20 MED ORDER — BENZONATATE 100 MG PO CAPS: 100.0000 mg | ORAL_CAPSULE | Freq: Three times a day (TID) | ORAL | Status: DC | PRN

## 2015-08-20 MED ORDER — IBUPROFEN 800 MG PO TABS
800.0000 mg | ORAL_TABLET | Freq: Once | ORAL | Status: AC
  Administered 2015-08-20: 800 mg via ORAL
  Filled 2015-08-20: qty 1

## 2015-08-20 MED ORDER — PREDNISONE 20 MG PO TABS: 40.0000 mg | ORAL_TABLET | Freq: Every day | ORAL | Status: DC

## 2015-08-20 MED ORDER — IBUPROFEN 800 MG PO TABS: 800.0000 mg | ORAL_TABLET | Freq: Three times a day (TID) | ORAL | Status: DC

## 2015-08-20 MED ORDER — BENZONATATE 100 MG PO CAPS
100.0000 mg | ORAL_CAPSULE | Freq: Once | ORAL | Status: AC
  Administered 2015-08-20: 100 mg via ORAL
  Filled 2015-08-20: qty 1

## 2015-08-20 NOTE — ED Notes (Signed)
Pt c/o non-productive cough with clear sputum, left ear pain starting today with pressure in the right ear. Has been managing symptoms with OTC cold and allergy medications. Also c/o scratchy throat and runny nose, and "feeling run down."

## 2015-08-20 NOTE — ED Notes (Signed)
Pt states that she needs to go home to her husband at 1530 because "he is total care."  [facility].

## 2015-08-20 NOTE — ED Provider Notes (Signed)
CSN: [id number]     Arrival date & time 08/20/15  1257 History  By signing my name below, I, Evon Slackerrance Branch, attest that this documentation has been prepared under the direction and in the presence of Bear StearnsKayla Dejohn Ibarra, PA-C. Electronically Signed: Evon Slackerrance Branch, ED Scribe. 08/20/2015. 2:50 PM.    Chief Complaint  Patient presents with  . Otalgia  . Cough   The history is provided by the patient. No language interpreter was used.   HPI Comments: Sara Sexton is a 50 y.o. female who presents to the Emergency Department complaining of cough onset 4 days prior. Pt also reports ear pain, slight SOB, wheezing and loose stools.  Pt states that she has tried nyquil and dayquil with no relief. Pt reports that her throat has been "scratchy" but denies sore throat. Pt states that the cough is causing her to feel SOB and exacerbating her asthma. Pt also report that she has been taking Singulair and Claritin with no relief. Denies blood in stool, abdominal pain, n/v.   Past Medical History  Diagnosis Date  . Hypertension   . Asthma    History reviewed. No pertinent past surgical history. History reviewed. No pertinent family history. Social History  Substance Use Topics  . Smoking status: Never Smoker   . Smokeless tobacco: None  . Alcohol Use: No   OB History    No data available     Review of Systems  HENT: Negative for sore throat.   Respiratory: Positive for cough, shortness of breath and wheezing.   Gastrointestinal: Negative for nausea, vomiting, abdominal pain and blood in stool.  All other systems reviewed and are negative.     Allergies  Latex; Penicillins; and Vagisil anti-itch medicated  Home Medications   Prior to Admission medications   Medication Sig Start Date End Date Taking? Authorizing Provider  albuterol (PROVENTIL HFA;VENTOLIN HFA) 108 (90 Base) MCG/ACT inhaler Inhale 2 puffs into the lungs every 6 (six) hours as needed for wheezing or shortness of breath.    Yes Historical Provider, MD  lisinopril (PRINIVIL,ZESTRIL) 20 MG tablet Take 20 mg by mouth daily.   Yes Historical Provider, MD  montelukast (SINGULAIR) 10 MG tablet Take 10 mg by mouth at bedtime.   Yes Historical Provider, MD  Pseudoeph-Doxylamine-DM-APAP (NYQUIL PO) Take 30 mLs by mouth at bedtime as needed (cold symptoms).   Yes Historical Provider, MD  Pseudoephedrine-APAP-DM (DAYQUIL PO) Take 30 mLs by mouth every 6 (six) hours as needed (cold symptoms).   Yes Historical Provider, MD  benzonatate (TESSALON) 100 MG capsule Take 1 capsule (100 mg total) by mouth 3 (three) times daily as needed for cough. 08/20/15   Moneisha Vosler, PA-C  fluticasone (FLONASE) 50 MCG/ACT nasal spray Place 2 sprays into both nostrils daily. Patient not taking: Reported on 08/20/2015 03/21/14   Fayrene HelperBowie Tran, PA-C  guaiFENesin (ROBITUSSIN) 100 MG/5ML liquid Take 5-10 mLs (100-200 mg total) by mouth every 4 (four) hours as needed for cough. Patient not taking: Reported on 08/20/2015 03/21/14   Fayrene HelperBowie Tran, PA-C  HYDROcodone-acetaminophen (HYCET) 7.5-325 mg/15 ml solution Take 15 mLs by mouth every 8 (eight) hours as needed for moderate pain or severe pain. Patient not taking: Reported on 08/20/2015 09/15/14   Antony MaduraKelly Humes, PA-C  ibuprofen (ADVIL,MOTRIN) 800 MG tablet Take 1 tablet (800 mg total) by mouth 3 (three) times daily. 08/20/15   Cheri FowlerKayla Reginna Sermeno, PA-C  naproxen (NAPROSYN) 500 MG tablet Take 1 tablet (500 mg total) by mouth 2 (two) times daily. Patient not  taking: Reported on 08/20/2015 09/15/14   Antony Madura, PA-C  predniSONE (DELTASONE) 20 MG tablet Take 2 tablets (40 mg total) by mouth daily. 08/20/15   Cheri Fowler, PA-C  sodium chloride (OCEAN) 0.65 % SOLN nasal spray Place 1 spray into both nostrils as needed for congestion. Patient not taking: Reported on 08/20/2015 09/15/14   Antony Madura, PA-C   BP 134/91 mmHg  Pulse 73  Temp(Src) 97.8 F (36.6 C) (Oral)  Resp 18  SpO2 100%   Physical Exam  Constitutional: She is  oriented to person, place, and time. She appears well-developed and well-nourished.  Non-toxic appearance. She does not have a sickly appearance. She does not appear ill.  HENT:  Head: Normocephalic and atraumatic.  Right Ear: Tympanic membrane and external ear normal.  Left Ear: Tympanic membrane and external ear normal.  Nose: Nose normal.  Mouth/Throat: Uvula is midline, oropharynx is clear and moist and mucous membranes are normal. No oropharyngeal exudate, posterior oropharyngeal edema or posterior oropharyngeal erythema.  Eyes: Conjunctivae are normal. No scleral icterus.  Neck: Normal range of motion. Neck supple. No tracheal deviation present.  Cardiovascular: Normal rate, regular rhythm and normal heart sounds.   No murmur heard. Pulmonary/Chest: Effort normal and breath sounds normal. No accessory muscle usage or stridor. No respiratory distress. She has no wheezes. She has no rhonchi. She has no rales.  No expiratory wheezes.  Oxygen saturation 98% on RA.  Abdominal: Soft. Bowel sounds are normal. She exhibits no distension. There is no tenderness.  Musculoskeletal: Normal range of motion.  Lymphadenopathy:    She has no cervical adenopathy.  Neurological: She is alert and oriented to person, place, and time.  Speech clear without dysarthria.  Skin: Skin is warm and dry.  Psychiatric: She has a normal mood and affect. Her behavior is normal.    ED Course  Procedures (including critical care time) DIAGNOSTIC STUDIES: Oxygen Saturation is 98% on RA, normal by my interpretation.    COORDINATION OF CARE: 2:49 PM-Discussed treatment plan with pt at bedside and pt agreed to plan.     Labs Review Labs Reviewed  RAPID STREP SCREEN (NOT AT [facility])  CULTURE, GROUP A STREP [facility])    Imaging Review Dg Chest 2 View  08/20/2015  CLINICAL DATA:  Cough for 4 days EXAM: CHEST  2 VIEW COMPARISON:  09/15/2014 FINDINGS: The heart size and mediastinal contours are within normal limits.  Both lungs are clear. The visualized skeletal structures are unremarkable. IMPRESSION: No active cardiopulmonary disease. Electronically Signed   By: Alcide Clever M.D.   On: 08/20/2015 15:26      EKG Interpretation None      MDM   Final diagnoses:  URI (upper respiratory infection)   Likely viral URI.  VSS, NAD.  HENT exam unremarkable.  TMs clear with good cone of light.  Lungs CTAB, no wheezing.  CXR negative.  Patient given tessalon perle, prednisone, and ibuprofen in ED.  Plan to discharge home with tessalon perle and ibuprofen.  Recommend continued Claritin and regular asthma medications.  Follow up PCP.  Discussed return precautions.  Patient agrees and acknowledges the above plan for discharge.   I personally performed the services described in this documentation, which was scribed in my presence. The recorded information has been reviewed and is accurate.      Cheri Fowler, PA-C 08/20/15 1536  Alvira Monday, MD 08/20/15 2328

## 2015-08-20 NOTE — ED Notes (Signed)
[facility]

## 2015-08-20 NOTE — Discharge Instructions (Signed)
Upper Respiratory Infection, Adult Most upper respiratory infections (URIs) are a viral infection of the air passages leading to the lungs. A URI affects the nose, throat, and upper air passages. The most common type of URI is nasopharyngitis and is typically referred to as "the common cold." URIs run their course and usually go away on their own. Most of the time, a URI does not require medical attention, but sometimes a bacterial infection in the upper airways can follow a viral infection. This is called a secondary infection. Sinus and middle ear infections are common types of secondary upper respiratory infections. Bacterial pneumonia can also complicate a URI. A URI can worsen asthma and chronic obstructive pulmonary disease (COPD). Sometimes, these complications can require emergency medical care and may be life threatening.  CAUSES Almost all URIs are caused by viruses. A virus is a type of germ and can spread from one person to another.  RISKS FACTORS You may be at risk for a URI if:   You smoke.   You have chronic heart or lung disease.  You have a weakened defense (immune) system.   You are very young or very old.   You have nasal allergies or asthma.  You work in crowded or poorly ventilated areas.  You work in health care facilities or schools. SIGNS AND SYMPTOMS  Symptoms typically develop 2-3 days after you come in contact with a cold virus. Most viral URIs last 7-10 days. However, viral URIs from the influenza virus (flu virus) can last 14-18 days and are typically more severe. Symptoms may include:   Runny or stuffy (congested) nose.   Sneezing.   Cough.   Sore throat.   Headache.   Fatigue.   Fever.   Loss of appetite.   Pain in your forehead, behind your eyes, and over your cheekbones (sinus pain).  Muscle aches.  DIAGNOSIS  Your health care provider may diagnose a URI by:  Physical exam.  Tests to check that your symptoms are not due to  another condition such as:  Strep throat.  Sinusitis.  Pneumonia.  Asthma. TREATMENT  A URI goes away on its own with time. It cannot be cured with medicines, but medicines may be prescribed or recommended to relieve symptoms. Medicines may help:  Reduce your fever.  Reduce your cough.  Relieve nasal congestion. HOME CARE INSTRUCTIONS   Take medicines only as directed by your health care provider.   Gargle warm saltwater or take cough drops to comfort your throat as directed by your health care provider.  Use a warm mist humidifier or inhale steam from a shower to increase air moisture. This may make it easier to breathe.  Drink enough fluid to keep your urine clear or pale yellow.   Eat soups and other clear broths and maintain good nutrition.   Rest as needed.   Return to work when your temperature has returned to normal or as your health care provider advises. You may need to stay home longer to avoid infecting others. You can also use a face mask and careful hand washing to prevent spread of the virus.  Increase the usage of your inhaler if you have asthma.   Do not use any tobacco products, including cigarettes, chewing tobacco, or electronic cigarettes. If you need help quitting, ask your health care provider. PREVENTION  The best way to protect yourself from getting a cold is to practice good hygiene.   Avoid oral or hand contact with people with cold   symptoms.   Wash your hands often if contact occurs.  There is no clear evidence that vitamin C, vitamin E, echinacea, or exercise reduces the chance of developing a cold. However, it is always recommended to get plenty of rest, exercise, and practice good nutrition.  SEEK MEDICAL CARE IF:   You are getting worse rather than better.   Your symptoms are not controlled by medicine.   You have chills.  You have worsening shortness of breath.  You have brown or red mucus.  You have yellow or brown nasal  discharge.  You have pain in your face, especially when you bend forward.  You have a fever.  You have swollen neck glands.  You have pain while swallowing.  You have white areas in the back of your throat. SEEK IMMEDIATE MEDICAL CARE IF:   You have severe or persistent:  Headache.  Ear pain.  Sinus pain.  Chest pain.  You have chronic lung disease and any of the following:  Wheezing.  Prolonged cough.  Coughing up blood.  A change in your usual mucus.  You have a stiff neck.  You have changes in your:  Vision.  Hearing.  Thinking.  Mood. MAKE SURE YOU:   Understand these instructions.  Will watch your condition.  Will get help right away if you are not doing well or get worse.   This information is not intended to replace advice given to you by your health care provider. Make sure you discuss any questions you have with your health care provider.   Document Released: 11/13/2000 Document Revised: 10/04/2014 Document Reviewed: 08/25/2013 Elsevier Interactive Patient Education 2016 [employer]  

## 2015-08-20 NOTE — ED Notes (Signed)
Pt left without her discharge instructions and prescriptions

## 2015-08-23 LAB — CULTURE, GROUP A STREP (THRC)

## 2015-12-28 ENCOUNTER — Emergency Department (HOSPITAL_COMMUNITY)
Admission: EM | Admit: 2015-12-28 | Discharge: 2015-12-28 | Disposition: A | Discharge status: Home / Self Care | Payer: No Typology Code available for payment source | Attending: Physician Assistant | Admitting: Physician Assistant

## 2015-12-28 ENCOUNTER — Encounter (HOSPITAL_COMMUNITY): Payer: Self-pay | Admitting: Emergency Medicine

## 2015-12-28 DIAGNOSIS — J45909 Unspecified asthma, uncomplicated: Secondary | ICD-10-CM | POA: Insufficient documentation

## 2015-12-28 DIAGNOSIS — T7840XD Allergy, unspecified, subsequent encounter: Secondary | ICD-10-CM | POA: Insufficient documentation

## 2015-12-28 DIAGNOSIS — I10 Essential (primary) hypertension: Secondary | ICD-10-CM | POA: Insufficient documentation

## 2015-12-28 DIAGNOSIS — T63444D Toxic effect of venom of bees, undetermined, subsequent encounter: Secondary | ICD-10-CM

## 2015-12-28 DIAGNOSIS — Z79899 Other long term (current) drug therapy: Secondary | ICD-10-CM | POA: Insufficient documentation

## 2015-12-28 DIAGNOSIS — T63441D Toxic effect of venom of bees, accidental (unintentional), subsequent encounter: Secondary | ICD-10-CM | POA: Insufficient documentation

## 2015-12-28 MED ORDER — PREDNISONE 20 MG PO TABS
60.0000 mg | ORAL_TABLET | Freq: Once | ORAL | Status: AC
  Administered 2015-12-28: 60 mg via ORAL
  Filled 2015-12-28: qty 3

## 2015-12-28 MED ORDER — PREDNISONE 20 MG PO TABS
ORAL_TABLET | ORAL | 0 refills | Status: AC
Start: 2015-12-28 — End: ?

## 2015-12-28 MED ORDER — DIPHENHYDRAMINE HCL 25 MG PO CAPS
25.0000 mg | ORAL_CAPSULE | Freq: Once | ORAL | Status: AC
  Administered 2015-12-28: 25 mg via ORAL
  Filled 2015-12-28: qty 1

## 2015-12-28 NOTE — ED Notes (Signed)
Patient requested to walk. Patient took all belongings with her. Patient also stated she is not driving and family member is coming to pick her up.

## 2015-12-28 NOTE — Discharge Instructions (Signed)
Please take this medication for the next several days to help decrease the allergic reaction response. You should continue take Benadryl as needed. If you've any trouble breathing please return immediately.

## 2015-12-28 NOTE — ED Triage Notes (Signed)
Pt reports she was stung by some yellow jackets on Friday. Was seen at an UC who gave her a shot and recommended OTC medications. Pt has had residual itching since then. Last night started having some similar symptoms to initial allergic reaction such as wheezing. Also has hx of asthma. No obvious wheezing, pt speaking in complete sentences.

## 2015-12-28 NOTE — ED Provider Notes (Signed)
[facility]-EMERGENCY DEPT Provider Note   CSN: [id number] Arrival date & time: 12/28/15  1658  First Provider Contact:  None       History   Chief Complaint No chief complaint on file.   HPI Sara Sexton is a 50 y.o. female.  HPI  Patient was stung by a bee several days ago. She seen in urgent care and had a local reaction in the 3 spots. She reports that she had some wheezing at the time and was treated for an allergic reaction. She's continues take Benadryl but his nurse that she still has a little bit of itching all over her body.  She says that today she perhaps felt a little wheezy and so came here to the emergency department. She's not had any trouble breathing or trouble swallowing no increase in rash.  Past Medical History:  Diagnosis Date  . Asthma   . Hypertension     There are no active problems to display for this patient.   History reviewed. No pertinent surgical history.  OB History    No data available       Home Medications    Prior to Admission medications   Medication Sig Start Date End Date Taking? Authorizing Provider  albuterol (PROVENTIL HFA;VENTOLIN HFA) 108 (90 Base) MCG/ACT inhaler Inhale 2 puffs into the lungs every 6 (six) hours as needed for wheezing or shortness of breath.   Yes Historical Provider, MD  diphenhydrAMINE (BENADRYL) 25 mg capsule Take 25 mg by mouth every 6 (six) hours as needed for itching or allergies.   Yes Historical Provider, MD  benzonatate (TESSALON) 100 MG capsule Take 1 capsule (100 mg total) by mouth 3 (three) times daily as needed for cough. Patient not taking: Reported on 12/28/2015 08/20/15   Cheri Fowler, PA-C  guaiFENesin (ROBITUSSIN) 100 MG/5ML liquid Take 5-10 mLs (100-200 mg total) by mouth every 4 (four) hours as needed for cough. Patient not taking: Reported on 08/20/2015 03/21/14   Fayrene Helper, PA-C  HYDROcodone-acetaminophen (HYCET) 7.5-325 mg/15 ml solution Take 15 mLs by mouth every 8 (eight) hours as needed  for moderate pain or severe pain. Patient not taking: Reported on 08/20/2015 09/15/14   Antony Madura, PA-C  ibuprofen (ADVIL,MOTRIN) 800 MG tablet Take 1 tablet (800 mg total) by mouth 3 (three) times daily. Patient not taking: Reported on 12/28/2015 08/20/15   Cheri Fowler, PA-C  predniSONE (DELTASONE) 20 MG tablet Take 2 tablets (40 mg total) by mouth daily. Patient not taking: Reported on 12/28/2015 08/20/15   Cheri Fowler, PA-C  sodium chloride (OCEAN) 0.65 % SOLN nasal spray Place 1 spray into both nostrils as needed for congestion. Patient not taking: Reported on 08/20/2015 09/15/14   Antony Madura, PA-C    Family History History reviewed. No pertinent family history.  Social History Social History  Substance Use Topics  . Smoking status: Never Smoker  . Smokeless tobacco: Not on file  . Alcohol use No     Allergies   Yellow jacket venom; Penicillins; Vagisil anti-itch medicated [pramoxine hcl]; and Latex   Review of Systems Review of Systems  Constitutional: Negative for activity change.  Respiratory: Positive for wheezing. Negative for cough and shortness of breath.   Cardiovascular: Negative for chest pain.  Gastrointestinal: Negative for abdominal pain.  Skin: Negative for rash.  All other systems reviewed and are negative.    Physical Exam Updated Vital Signs BP (!) 167/101   Pulse 73   Temp 98.1 F (36.7 C) (Oral)  Resp 18   SpO2 100%   Physical Exam  Constitutional: She appears well-developed and well-nourished. No distress.  HENT:  Head: Normocephalic and atraumatic.  Eyes: Conjunctivae are normal.  Neck: Neck supple.  Cardiovascular: Normal rate and regular rhythm.   No murmur heard. Pulmonary/Chest: Effort normal and breath sounds normal. No respiratory distress.  No wheezing  Abdominal: Soft. There is no tenderness.  Musculoskeletal: She exhibits no edema.  Neurological: She is alert.  Skin: Skin is warm and dry.  3 areas of local erythema consistent  with bug bite. No hives.  Psychiatric: She has a normal mood and affect.  Nursing note and vitals reviewed.    ED Treatments / Results  Labs (all labs ordered are listed, but only abnormal results are displayed) Labs Reviewed - No data to display  EKG  EKG Interpretation None       Radiology No results found.  Procedures Procedures (including critical care time)  Medications Ordered in ED Medications  diphenhydrAMINE (BENADRYL) capsule 25 mg (not administered)  predniSONE (DELTASONE) tablet 60 mg (not administered)     Initial Impression / Assessment and Plan / ED Course  I have reviewed the triage vital signs and the nursing notes.  Pertinent labs & imaging results that were available during my care of the patient were reviewed by me and considered in my medical decision making (see chart for details).  Clinical Course    Patient is a 50 year old female presenting multiple days after a exposure to be. Patient has no signs of allergic reaction. She's got no hives, no wheezing, no shortness of breath. However given his subjective symptoms of itching and wheezing we will give her a short course of prednisone and a work note as requested.  Final Clinical Impressions(s) / ED Diagnoses   Final diagnoses:  None    New Prescriptions New Prescriptions   No medications on file     Exavier Lina Randall An, MD 12/28/15 2006

## 2017-11-14 IMAGING — CR DG CHEST 2V
2 series · 2 of 2 positions shown · non-contrast
Comparison: 09/15/2014

CLINICAL DATA: Cough for 4 days

EXAM:
CHEST  2 VIEW

[w chest pa]
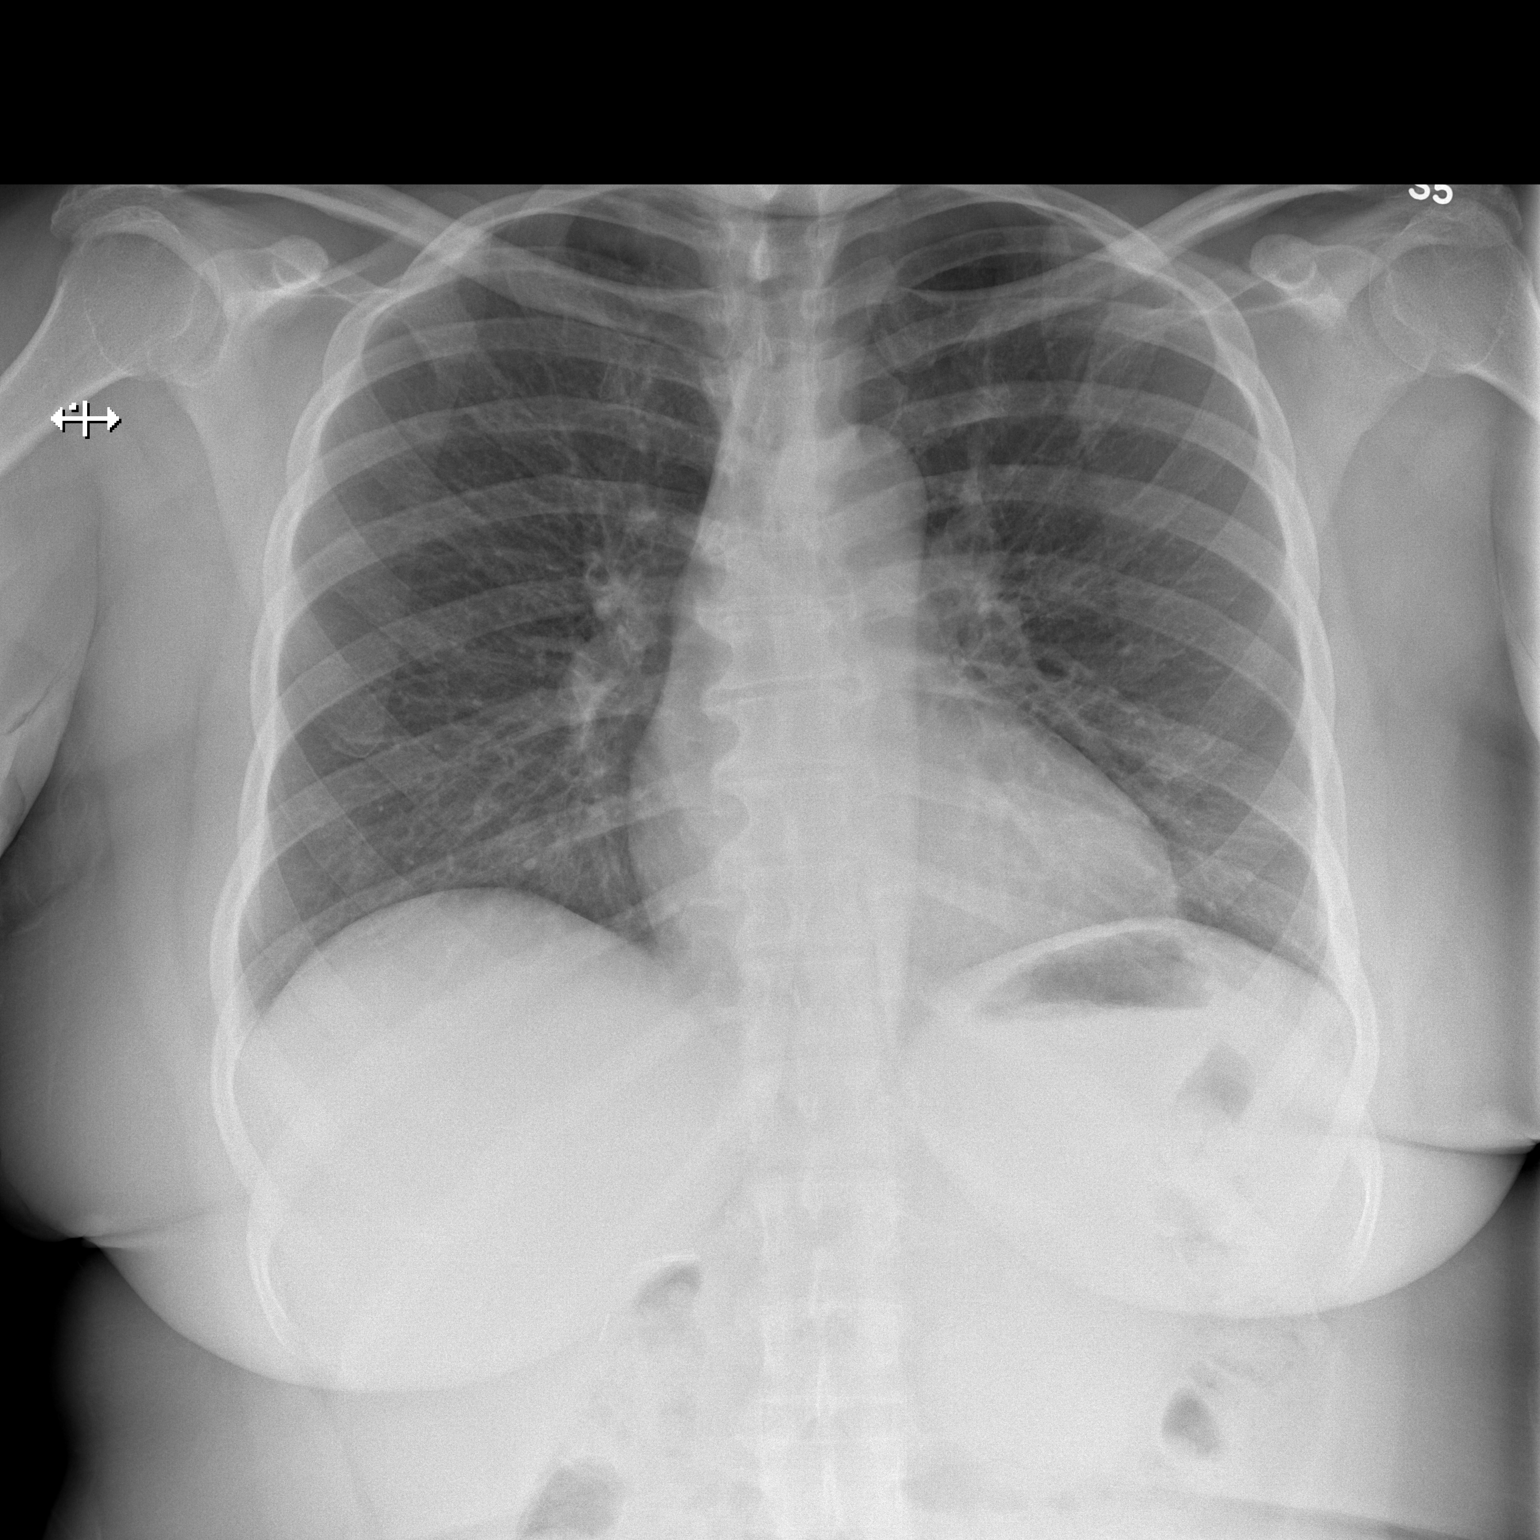

[w chest lat]
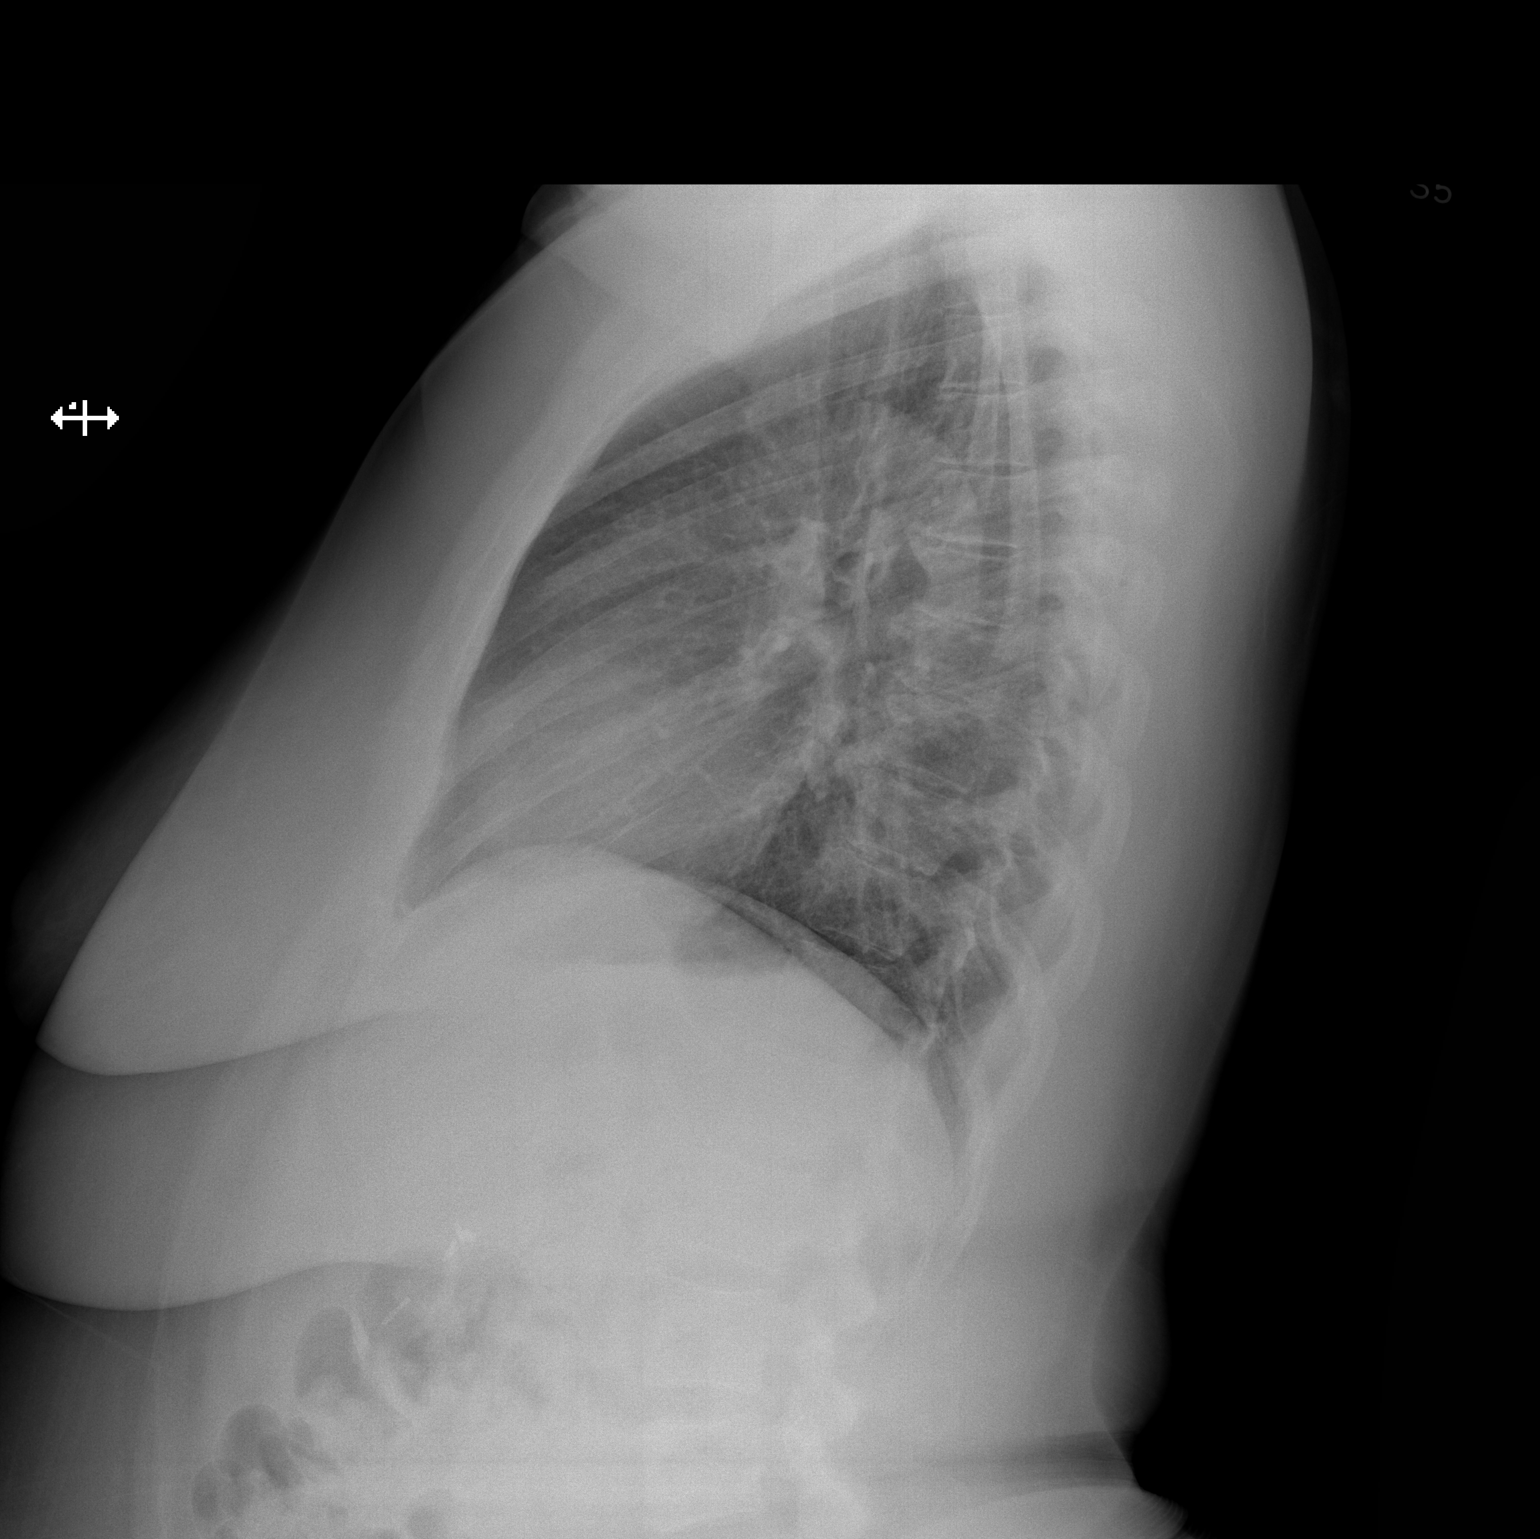

[2 of 2 positions shown; findings below may reference images not displayed]

FINDINGS: The heart size and mediastinal contours are within normal limits.
Both lungs are clear. The visualized skeletal structures are
unremarkable.
IMPRESSION: No active cardiopulmonary disease.
